# Patient Record
Sex: Male | Born: 1975
Health system: Southern US, Community
[De-identification: ages and names within clinical notes are randomized; demographics above are authoritative.]

## PROBLEM LIST (undated history)

## (undated) DIAGNOSIS — J45909 Unspecified asthma, uncomplicated: Secondary | ICD-10-CM

## (undated) HISTORY — PX: HAND SURGERY: SHX662

---

## 2010-04-20 ENCOUNTER — Emergency Department (INDEPENDENT_AMBULATORY_CARE_PROVIDER_SITE_OTHER): Payer: Self-pay

## 2010-04-20 ENCOUNTER — Emergency Department (HOSPITAL_BASED_OUTPATIENT_CLINIC_OR_DEPARTMENT_OTHER)
Admission: EM | Admit: 2010-04-20 | Discharge: 2010-04-20 | Payer: Self-pay | Attending: Emergency Medicine | Admitting: Emergency Medicine

## 2010-04-20 DIAGNOSIS — F17200 Nicotine dependence, unspecified, uncomplicated: Secondary | ICD-10-CM | POA: Insufficient documentation

## 2010-04-20 DIAGNOSIS — M25569 Pain in unspecified knee: Secondary | ICD-10-CM

## 2010-04-20 DIAGNOSIS — Z043 Encounter for examination and observation following other accident: Secondary | ICD-10-CM

## 2010-04-20 DIAGNOSIS — Y92410 Unspecified street and highway as the place of occurrence of the external cause: Secondary | ICD-10-CM | POA: Insufficient documentation

## 2010-04-20 DIAGNOSIS — IMO0002 Reserved for concepts with insufficient information to code with codable children: Secondary | ICD-10-CM | POA: Insufficient documentation

## 2010-04-20 DIAGNOSIS — J45909 Unspecified asthma, uncomplicated: Secondary | ICD-10-CM | POA: Insufficient documentation

## 2010-10-01 ENCOUNTER — Emergency Department (INDEPENDENT_AMBULATORY_CARE_PROVIDER_SITE_OTHER): Payer: Medicare Other

## 2010-10-01 ENCOUNTER — Emergency Department (HOSPITAL_BASED_OUTPATIENT_CLINIC_OR_DEPARTMENT_OTHER)
Admission: EM | Admit: 2010-10-01 | Discharge: 2010-10-01 | Disposition: A | Discharge status: Home / Self Care | Payer: Medicare Other | Attending: Emergency Medicine | Admitting: Emergency Medicine

## 2010-10-01 DIAGNOSIS — R0602 Shortness of breath: Secondary | ICD-10-CM | POA: Insufficient documentation

## 2010-10-01 DIAGNOSIS — J45909 Unspecified asthma, uncomplicated: Secondary | ICD-10-CM

## 2010-10-01 DIAGNOSIS — R05 Cough: Secondary | ICD-10-CM

## 2010-10-01 MED ORDER — ALBUTEROL SULFATE (5 MG/ML) 0.5% IN NEBU
INHALATION_SOLUTION | RESPIRATORY_TRACT | Status: AC
  Administered 2010-10-01: 5 mg via RESPIRATORY_TRACT
  Filled 2010-10-01: qty 1

## 2010-10-01 MED ORDER — ALBUTEROL SULFATE HFA 108 (90 BASE) MCG/ACT IN AERS: 1.0000 | INHALATION_SPRAY | Freq: Four times a day (QID) | RESPIRATORY_TRACT | Status: DC | PRN

## 2010-10-01 MED ORDER — ALBUTEROL SULFATE (5 MG/ML) 0.5% IN NEBU
5.0000 mg | INHALATION_SOLUTION | Freq: Once | RESPIRATORY_TRACT | Status: AC
Start: 2010-10-01 — End: 2010-10-01
  Administered 2010-10-01 (×2): 5 mg via RESPIRATORY_TRACT
  Filled 2010-10-01: qty 1

## 2010-10-01 MED ORDER — IPRATROPIUM BROMIDE 0.02 % IN SOLN
0.5000 mg | Freq: Once | RESPIRATORY_TRACT | Status: AC
  Administered 2010-10-01: 0.5 mg via RESPIRATORY_TRACT
  Filled 2010-10-01: qty 2.5

## 2010-10-01 MED ORDER — ALBUTEROL SULFATE HFA 108 (90 BASE) MCG/ACT IN AERS
2.0000 | INHALATION_SPRAY | Freq: Once | RESPIRATORY_TRACT | Status: AC
  Administered 2010-10-01: 2 via RESPIRATORY_TRACT
  Filled 2010-10-01: qty 6.7

## 2010-10-01 MED ORDER — ALBUTEROL SULFATE (5 MG/ML) 0.5% IN NEBU: 5.0000 mg | INHALATION_SOLUTION | Freq: Once | RESPIRATORY_TRACT | Status: DC

## 2010-10-01 NOTE — ED Notes (Signed)
C/o wheezing, nonprod cough started last night

## 2010-10-01 NOTE — ED Provider Notes (Deleted)
History     CSN: [id number] Arrival date & time: 10/01/2010  7:28 PM  Chief Complaint  Patient presents with  . Asthma   HPI  Past Medical History  Diagnosis Date  . Asthma     History reviewed. No pertinent past surgical history.  No family history on file.  History  Substance Use Topics  . Smoking status: Never Smoker   . Smokeless tobacco: Not on file  . Alcohol Use: No      Review of Systems  Physical Exam  BP 112/68  Pulse 78  Temp(Src) 99.7 F (37.6 C) (Oral)  Resp 18  Ht 5\' 11"  (1.803 m)  Wt 200 lb (90.719 kg)  BMI 27.89 kg/m2  SpO2 98%  Physical Exam  ED Course  Procedures   No results found for this or any previous visit. Dg Chest 2 View  10/01/2010  *RADIOLOGY REPORT*  Clinical Data: Shortness of breath and cough; history of asthma.  CHEST - 2 VIEW  Comparison: None.  Findings: The lungs are well-aerated.  Peribronchial thickening is noted.  There is no evidence of focal opacification, pleural effusion or pneumothorax.  The heart is normal in size; the mediastinal contour is within normal limits.  No acute osseous abnormalities are seen.  IMPRESSION: Peribronchial thickening noted; lungs otherwise clear.  Original Report Authenticated By: Tonia Ghent, M.D.     MDM HX OF ASTHMA WHEEZING RESOLVED IN ED WITH NEBULIZER X2 ALBUTEROL ATROVENT. CXR NEG.       Shelda Jakes, MD 10/02/10 [phone]

## 2010-10-01 NOTE — ED Notes (Signed)
Pt has an hx of asthma and is currently out of his inhaler (Albuterol) but is here in the ED due to [facility] (ins_exp) bilateral wheezes.

## 2010-10-01 NOTE — ED Notes (Signed)
Pt has gotten 2 HHN tx (Albuterol/Atrovent) & Albuterol 5mg  and a chest Xray

## 2010-10-01 NOTE — ED Provider Notes (Signed)
History  Scribed for Dr. Mellody Life, the patient was seen in room 4. The chart was scribed by Gilman Schmidt. The patients care was started at 2240.   CSN: [id number] Arrival date & time: 10/01/2010  7:28 PM  Chief Complaint  Patient presents with  . Asthma   HPI Alexis Chapman is a 35 y.o. male who presents to the Emergency Department complaining of asthma. Pt describes wheezing, difficulty breathing, chest tightness, and nonproductive cough onset last night. States symptoms have improved after two breathing treatment in ED. Symptoms are normally alleviated by inhaler but pt reports running out last week. Denies any fever, abdominal pain, rash, headache, N/V. There are no other associated symptoms and no other alleviating or aggravating factors.   HPI ELEMENTS: Onset: last night Duration: persistent since onset  Modifying factors: inhaler Context: as above  Associated symptoms: denies any fever, abdominal pain, rash, headache, N/V   PAST MEDICAL HISTORY:  Past Medical History  Diagnosis Date  . Asthma      PAST SURGICAL HISTORY:  History reviewed. No pertinent past surgical history.   MEDICATIONS:  Previous Medications   ALBUTEROL (PROVENTIL HFA;VENTOLIN HFA) 108 (90 BASE) MCG/ACT INHALER    Inhale 2 puffs into the lungs 3 (three) times daily.     ALBUTEROL (PROVENTIL) (2.5 MG/3ML) 0.083% NEBULIZER SOLUTION    Take 2.5 mg by nebulization 3 (three) times daily.     IPRATROPIUM (ATROVENT) 0.02 % NEBULIZER SOLUTION    Take 500 mcg by nebulization 3 (three) times daily.     MOMETASONE (NASONEX) 50 MCG/ACT NASAL SPRAY    Place 1 spray into the nose daily.       ALLERGIES:  Allergies as of 10/01/2010 - Review Complete 10/01/2010  Allergen Reaction Noted  . Shellfish allergy Anaphylaxis 10/01/2010  . Penicillins  10/01/2010     FAMILY HISTORY:  No family history on file.   SOCIAL HISTORY: History  Substance Use Topics  . Smoking status: Never Smoker   . Smokeless tobacco: Not  on file  . Alcohol Use: No      Review of Systems  Constitutional: Negative for fever.  Respiratory: Positive for cough, chest tightness and wheezing.        Difficulty breathing  Gastrointestinal: Negative for nausea, vomiting and abdominal pain.  Skin: Negative for rash.  Neurological: Negative for headaches.    Physical Exam  BP 112/68  Pulse 78  Temp(Src) 99.7 F (37.6 C) (Oral)  Resp 18  Ht 5\' 11"  (1.803 m)  Wt 200 lb (90.719 kg)  BMI 27.89 kg/m2  SpO2 98%  Physical Exam  Nursing note and vitals reviewed. Constitutional: He is oriented to person, place, and time. He appears well-developed and well-nourished.  HENT:  Head: Normocephalic and atraumatic.  Eyes: Conjunctivae and EOM are normal. Pupils are equal, round, and reactive to light.  Neck: Normal range of motion. Neck supple.  Cardiovascular: Normal rate, regular rhythm and normal heart sounds.   Pulmonary/Chest: Effort normal and breath sounds normal. He has no wheezes.  Abdominal: Soft. Bowel sounds are normal. There is no tenderness.  Musculoskeletal: Normal range of motion.  Neurological: He is alert and oriented to person, place, and time.  Skin: Skin is warm and dry.  Psychiatric: He has a normal mood and affect.      OTHER DATA REVIEWED: Nursing notes, vital signs, and past medical records reviewed.   DIAGNOSTIC STUDIES: Oxygen Saturation is 98% on room air, normal by my interpretation.    RADIOLOGY:  DG Chest:  IMPRESSION: Peribronchial thickening noted; lungs otherwise clear.  Original Report Authenticated By: Tonia Ghent, M.D   PROCEDURES: NONE   ED COURSE / COORDINATION OF CARE:    IMPRESSION: Diagnoses that have been ruled out:  Diagnoses that are still under consideration:  Final diagnoses:  Unspecified asthma    PLAN:  Home  The patient is to return the emergency department if there is any worsening of symptoms. I have reviewed the discharge instructions with the  PATIENT  CONDITION ON DISCHARGE: IMPROVED STABLE  MEDICATIONS GIVEN IN THE E.D.  Medications  albuterol (PROVENTIL HFA;VENTOLIN HFA) 108 (90 BASE) MCG/ACT inhaler (not administered)  albuterol (PROVENTIL) (2.5 MG/3ML) 0.083% nebulizer solution (not administered)  ipratropium (ATROVENT) 0.02 % nebulizer solution (not administered)  mometasone (NASONEX) 50 MCG/ACT nasal spray (not administered)  albuterol (PROVENTIL) (5 MG/ML) 0.5% nebulizer solution 5 mg (not administered)  albuterol (PROVENTIL HFA;VENTOLIN HFA) 108 (90 BASE) MCG/ACT inhaler (not administered)  albuterol (PROVENTIL) (5 MG/ML) 0.5% nebulizer solution 5 mg (5 mg Nebulization Given 10/01/10 2015)  ipratropium (ATROVENT) 0.02 % nebulizer solution 0.5 mg (0.5 mg Nebulization Given 10/01/10 1947)  albuterol (PROVENTIL HFA;VENTOLIN HFA) inhaler 2 puff (2 puff Inhalation Given 10/01/10 2316)    DISCHARGE MEDICATIONS: New Prescriptions   ALBUTEROL (PROVENTIL HFA;VENTOLIN HFA) 108 (90 BASE) MCG/ACT INHALER    Inhale 1-2 puffs into the lungs every 6 (six) hours as needed for wheezing.    SCRIBE ATTESTATION:   I personally performed the services described in this documentation, which was scribed in my presence. The recorded information has been reviewed and considered. No att. providers found   Procedures  MDM KNOWN ASTHMATIC AND WHEEZING RESOLVED WITH 2 NEBS ALBUTEROL AND ATROVENT IN ED. CXR NEGATIVE.       Shelda Jakes, MD 10/02/10 [phone]

## 2010-10-01 NOTE — ED Notes (Signed)
Patient states that he has been with no RX and sick for the past two days with no Albuterol  inhaler.

## 2010-10-01 NOTE — ED Notes (Signed)
RT at bedside administering breathing tx.

## 2012-05-04 ENCOUNTER — Emergency Department (HOSPITAL_BASED_OUTPATIENT_CLINIC_OR_DEPARTMENT_OTHER)
Admission: EM | Admit: 2012-05-04 | Discharge: 2012-05-04 | Disposition: A | Discharge status: Home / Self Care | Payer: Medicare Other | Attending: Emergency Medicine | Admitting: Emergency Medicine

## 2012-05-04 ENCOUNTER — Encounter (HOSPITAL_BASED_OUTPATIENT_CLINIC_OR_DEPARTMENT_OTHER): Payer: Self-pay | Admitting: *Deleted

## 2012-05-04 DIAGNOSIS — J9801 Acute bronchospasm: Secondary | ICD-10-CM

## 2012-05-04 DIAGNOSIS — Z79899 Other long term (current) drug therapy: Secondary | ICD-10-CM | POA: Insufficient documentation

## 2012-05-04 DIAGNOSIS — J45901 Unspecified asthma with (acute) exacerbation: Secondary | ICD-10-CM | POA: Insufficient documentation

## 2012-05-04 DIAGNOSIS — R059 Cough, unspecified: Secondary | ICD-10-CM | POA: Insufficient documentation

## 2012-05-04 DIAGNOSIS — R05 Cough: Secondary | ICD-10-CM | POA: Insufficient documentation

## 2012-05-04 DIAGNOSIS — R0789 Other chest pain: Secondary | ICD-10-CM | POA: Insufficient documentation

## 2012-05-04 MED ORDER — ALBUTEROL SULFATE HFA 108 (90 BASE) MCG/ACT IN AERS
2.0000 | INHALATION_SPRAY | Freq: Once | RESPIRATORY_TRACT | Status: AC
  Administered 2012-05-04: 2 via RESPIRATORY_TRACT
  Filled 2012-05-04: qty 6.7

## 2012-05-04 MED ORDER — IPRATROPIUM BROMIDE 0.02 % IN SOLN: 0.5000 mg | Freq: Once | RESPIRATORY_TRACT | Status: DC

## 2012-05-04 MED ORDER — ALBUTEROL SULFATE (5 MG/ML) 0.5% IN NEBU
5.0000 mg | INHALATION_SOLUTION | Freq: Once | RESPIRATORY_TRACT | Status: AC
  Administered 2012-05-04: 5 mg via RESPIRATORY_TRACT
  Filled 2012-05-04: qty 1

## 2012-05-04 MED ORDER — PREDNISONE 50 MG PO TABS
60.0000 mg | ORAL_TABLET | Freq: Once | ORAL | Status: AC
  Administered 2012-05-04: 50 mg via ORAL
  Filled 2012-05-04: qty 1

## 2012-05-04 NOTE — ED Notes (Addendum)
Cough, runny nose and headache since working in the yard 2 days ago. Hx of asthma. He ran out of Albuterol last week. Chest tightness with breathing.

## 2012-05-04 NOTE — ED Provider Notes (Signed)
History     CSN: [id number]  Arrival date & time 05/04/12  1419   First MD Initiated Contact with Patient 05/04/12 1436      Chief Complaint  Patient presents with  . URI    (Consider location/radiation/quality/duration/timing/severity/associated sxs/prior treatment) HPI Comments: 37 year old male with a past medical history of asthma presents the emergency department complaining of chest tightness and wheezing x3 days. Patient states he was out working in the yard a few days ago when his symptoms began. Since then he has had a productive cough with clear phlegm, chest tightness and wheezing. He ran out of his albuterol inhaler one week ago. Despite triage summary, patient denies headache or head congestion. No fever, chills, chest pain or shortness of breath.  Patient is a 37 y.o. male presenting with URI. The history is provided by the patient.  URI Presenting symptoms: cough   Presenting symptoms: no congestion, no fever and no sore throat   Associated symptoms: wheezing     Past Medical History  Diagnosis Date  . Asthma     History reviewed. No pertinent past surgical history.  No family history on file.  History  Substance Use Topics  . Smoking status: Never Smoker   . Smokeless tobacco: Not on file  . Alcohol Use: No      Review of Systems  Constitutional: Negative for fever and chills.  HENT: Negative for congestion, sore throat and sinus pressure.   Respiratory: Positive for cough, chest tightness and wheezing. Negative for shortness of breath.   Cardiovascular: Negative for chest pain.  All other systems reviewed and are negative.    Allergies  Shellfish allergy and Penicillins  Home Medications   Current Outpatient Rx  Name  Route  Sig  Dispense  Refill  . albuterol (PROVENTIL HFA;VENTOLIN HFA) 108 (90 BASE) MCG/ACT inhaler   Inhalation   Inhale 2 puffs into the lungs 3 (three) times daily.           Marland Kitchen EXPIRED: albuterol (PROVENTIL  HFA;VENTOLIN HFA) 108 (90 BASE) MCG/ACT inhaler   Inhalation   Inhale 1-2 puffs into the lungs every 6 (six) hours as needed for wheezing.   1 Inhaler   0   . albuterol (PROVENTIL) (2.5 MG/3ML) 0.083% nebulizer solution   Nebulization   Take 2.5 mg by nebulization 3 (three) times daily.           Marland Kitchen ipratropium (ATROVENT) 0.02 % nebulizer solution   Nebulization   Take 500 mcg by nebulization 3 (three) times daily.           . mometasone (NASONEX) 50 MCG/ACT nasal spray   Nasal   Place 1 spray into the nose daily.             BP 119/75  Pulse 88  Temp(Src) 98.9 F (37.2 C) (Oral)  Resp 18  Ht 5\' 11"  (1.803 m)  Wt 170 lb (77.111 kg)  BMI 23.72 kg/m2  SpO2 97%  Physical Exam  Nursing note and vitals reviewed. Constitutional: He is oriented to person, place, and time. He appears well-developed and well-nourished. No distress.  HENT:  Head: Normocephalic and atraumatic.  Nose: No mucosal edema or rhinorrhea. Right sinus exhibits no maxillary sinus tenderness and no frontal sinus tenderness. Left sinus exhibits no maxillary sinus tenderness and no frontal sinus tenderness.  Mouth/Throat: Uvula is midline and mucous membranes are normal. Posterior oropharyngeal erythema present. No oropharyngeal exudate or posterior oropharyngeal edema.  Eyes: Conjunctivae are normal.  Neck:  Normal range of motion. Neck supple.  Cardiovascular: Normal rate, regular rhythm, normal heart sounds and intact distal pulses.   Pulmonary/Chest: Effort normal. No accessory muscle usage. No respiratory distress. He has wheezes (scattered inspiratory and expiratory bilateral). He has no rhonchi. He has no rales.  Musculoskeletal: Normal range of motion. He exhibits no edema.  Lymphadenopathy:    He has no cervical adenopathy.  Neurological: He is alert and oriented to person, place, and time.  Skin: Skin is warm and dry. He is not diaphoretic.  Psychiatric: He has a normal mood and affect. His  behavior is normal.    ED Course  Procedures (including critical care time)  Labs Reviewed - No data to display No results found.   1. Asthma exacerbation   2. Bronchospasm       MDM  37 y/o asthmatic male with wheezing. Afebrile, vitals stable, NAD. Giving albuterol breathing treatment and 60mg  PO prednisone. 3:26 PM Marked clinical improvement noted with albuterol breathing treatment and prednisone. Will discharge with albuterol inhaler. Resource guide given for PCP follow up. Return precautions discussed. Patient states understanding of plan and is agreeable.        Trevor Mace, PA-C 05/04/12 1527

## 2012-05-06 NOTE — ED Provider Notes (Signed)
Medical screening examination/treatment/procedure(s) were performed by non-physician practitioner and as supervising physician I was immediately available for consultation/collaboration.  Evelyn Moch T Quaran Kedzierski, MD 05/06/12 0829 

## 2012-10-23 ENCOUNTER — Encounter (HOSPITAL_BASED_OUTPATIENT_CLINIC_OR_DEPARTMENT_OTHER): Payer: Self-pay

## 2012-10-23 ENCOUNTER — Emergency Department (HOSPITAL_BASED_OUTPATIENT_CLINIC_OR_DEPARTMENT_OTHER)
Admission: EM | Admit: 2012-10-23 | Discharge: 2012-10-23 | Disposition: A | Discharge status: Home / Self Care | Payer: Medicare Other | Attending: Emergency Medicine | Admitting: Emergency Medicine

## 2012-10-23 DIAGNOSIS — J069 Acute upper respiratory infection, unspecified: Secondary | ICD-10-CM | POA: Insufficient documentation

## 2012-10-23 DIAGNOSIS — Z79899 Other long term (current) drug therapy: Secondary | ICD-10-CM | POA: Insufficient documentation

## 2012-10-23 DIAGNOSIS — J45909 Unspecified asthma, uncomplicated: Secondary | ICD-10-CM | POA: Insufficient documentation

## 2012-10-23 DIAGNOSIS — Z880 Allergy status to penicillin: Secondary | ICD-10-CM | POA: Insufficient documentation

## 2012-10-23 MED ORDER — ALBUTEROL SULFATE HFA 108 (90 BASE) MCG/ACT IN AERS
INHALATION_SPRAY | RESPIRATORY_TRACT | Status: AC
  Filled 2012-10-23: qty 6.7

## 2012-10-23 MED ORDER — PREDNISONE 10 MG PO TABS: 20.0000 mg | ORAL_TABLET | Freq: Every day | ORAL | Status: DC

## 2012-10-23 MED ORDER — ALBUTEROL SULFATE HFA 108 (90 BASE) MCG/ACT IN AERS
2.0000 | INHALATION_SPRAY | RESPIRATORY_TRACT | Status: DC
  Administered 2012-10-23: 2 via RESPIRATORY_TRACT

## 2012-10-23 MED ORDER — ALBUTEROL SULFATE (5 MG/ML) 0.5% IN NEBU
INHALATION_SOLUTION | RESPIRATORY_TRACT | Status: AC
  Administered 2012-10-23: 5 mg via RESPIRATORY_TRACT
  Filled 2012-10-23: qty 1

## 2012-10-23 MED ORDER — ALBUTEROL SULFATE (5 MG/ML) 0.5% IN NEBU
5.0000 mg | INHALATION_SOLUTION | Freq: Once | RESPIRATORY_TRACT | Status: AC
  Administered 2012-10-23: 5 mg via RESPIRATORY_TRACT

## 2012-10-23 NOTE — ED Provider Notes (Signed)
CSN: [id number]     Arrival date & time 10/23/12  2137 History  This chart was scribed for Hilario Quarry, MD by Ronal Fear, ED Scribe. This patient was seen in room MH03/MH03 and the patient's care was started at 10:51 PM.   Chief Complaint  Patient presents with  . Cough    Patient is a 37 y.o. male presenting with cough.  Cough Cough characteristics:  Productive Sputum characteristics:  Clear Severity:  Mild Onset quality:  Sudden Duration:  1 day Timing:  Intermittent Progression:  Resolved Chronicity:  Recurrent Smoker: yes   Worsened by:  Environmental changes Associated symptoms: chills   Associated symptoms: no fever and no wheezing     HPI Comments: Dquan Cortopassi is a 37 y.o. male with a hx of asthma who presents to the Emergency Department complaining of a productive cough associated with his asthma onset 1 day ago. Pt states that he began feeling sick with the weather change. Pt was given 2 albuterol treatments prior to his evaluation. He denies fever. but has chills. Denies any other medical problems. Pt is a smoker. He appears to be in no acute distress after the treatments, with no other complaints.   Past Medical History  Diagnosis Date  . Asthma    Past Surgical History  Procedure Laterality Date  . Hand surgery     No family history on file. History  Substance Use Topics  . Smoking status: Never Smoker   . Smokeless tobacco: Not on file  . Alcohol Use: Yes    Review of Systems  Constitutional: Positive for chills. Negative for fever.  Respiratory: Positive for cough. Negative for wheezing.   All other systems reviewed and are negative.    Allergies  Shellfish allergy and Penicillins  Home Medications   Current Outpatient Rx  Name  Route  Sig  Dispense  Refill  . albuterol (PROVENTIL HFA;VENTOLIN HFA) 108 (90 BASE) MCG/ACT inhaler   Inhalation   Inhale 2 puffs into the lungs 3 (three) times daily.           Marland Kitchen EXPIRED: albuterol  (PROVENTIL HFA;VENTOLIN HFA) 108 (90 BASE) MCG/ACT inhaler   Inhalation   Inhale 1-2 puffs into the lungs every 6 (six) hours as needed for wheezing.   1 Inhaler   0   . albuterol (PROVENTIL) (2.5 MG/3ML) 0.083% nebulizer solution   Nebulization   Take 2.5 mg by nebulization 3 (three) times daily.           Marland Kitchen ipratropium (ATROVENT) 0.02 % nebulizer solution   Nebulization   Take 500 mcg by nebulization 3 (three) times daily.           . mometasone (NASONEX) 50 MCG/ACT nasal spray   Nasal   Place 1 spray into the nose daily.            BP 134/76  Pulse 95  Temp(Src) 99.4 F (37.4 C) (Oral)  Resp 20  SpO2 98% Physical Exam  Nursing note and vitals reviewed. Constitutional: He is oriented to person, place, and time. He appears well-developed and well-nourished. No distress.  HENT:  Head: Normocephalic and atraumatic.  Eyes: EOM are normal.  Neck: Neck supple. No tracheal deviation present.  Cardiovascular: Normal rate.   Pulmonary/Chest: Effort normal and breath sounds normal. No respiratory distress. He has no wheezes.  Musculoskeletal: Normal range of motion.  Neurological: He is alert and oriented to person, place, and time.  Skin: Skin is warm and dry.  Psychiatric: He has a normal mood and affect. His behavior is normal.    ED Course  Procedures (including critical care time)  DIAGNOSTIC STUD  COORDINATION OF CARE: 10:56 PM- Pt advised of plan for treatment including albuterol inhaler and a breathing treatment which he was already given prior to his examination and pt understood.       Labs Review Labs Reviewed - No data to display Imaging Review No results found.  MDM  No diagnosis found. I personally performed the services described in this documentation, which was scribed in my presence. The recorded information has been reviewed and considered.    Hilario Quarry, MD 10/23/12 2352

## 2012-10-23 NOTE — ED Notes (Signed)
C/o prod cough started yesterday

## 2013-06-07 ENCOUNTER — Emergency Department (HOSPITAL_BASED_OUTPATIENT_CLINIC_OR_DEPARTMENT_OTHER)
Admission: EM | Admit: 2013-06-07 | Discharge: 2013-06-07 | Disposition: A | Discharge status: Home / Self Care | Payer: Medicare Other | Attending: Emergency Medicine | Admitting: Emergency Medicine

## 2013-06-07 ENCOUNTER — Encounter (HOSPITAL_BASED_OUTPATIENT_CLINIC_OR_DEPARTMENT_OTHER): Payer: Self-pay | Admitting: Emergency Medicine

## 2013-06-07 DIAGNOSIS — J45909 Unspecified asthma, uncomplicated: Secondary | ICD-10-CM | POA: Diagnosis not present

## 2013-06-07 DIAGNOSIS — F17200 Nicotine dependence, unspecified, uncomplicated: Secondary | ICD-10-CM | POA: Diagnosis not present

## 2013-06-07 DIAGNOSIS — Z880 Allergy status to penicillin: Secondary | ICD-10-CM | POA: Insufficient documentation

## 2013-06-07 DIAGNOSIS — Z79899 Other long term (current) drug therapy: Secondary | ICD-10-CM | POA: Insufficient documentation

## 2013-06-07 DIAGNOSIS — IMO0002 Reserved for concepts with insufficient information to code with codable children: Secondary | ICD-10-CM | POA: Insufficient documentation

## 2013-06-07 DIAGNOSIS — J069 Acute upper respiratory infection, unspecified: Secondary | ICD-10-CM | POA: Diagnosis not present

## 2013-06-07 MED ORDER — FLUTICASONE PROPIONATE 50 MCG/ACT NA SUSP: 2.0000 | Freq: Every day | NASAL | Status: DC

## 2013-06-07 MED ORDER — FEXOFENADINE-PSEUDOEPHED ER 60-120 MG PO TB12: 1.0000 | ORAL_TABLET | Freq: Two times a day (BID) | ORAL | Status: DC

## 2013-06-07 MED ORDER — DEXAMETHASONE SODIUM PHOSPHATE 10 MG/ML IJ SOLN
10.0000 mg | Freq: Once | INTRAMUSCULAR | Status: AC
  Administered 2013-06-07: 10 mg via INTRAMUSCULAR
  Filled 2013-06-07: qty 1

## 2013-06-07 NOTE — Discharge Instructions (Signed)

## 2013-06-07 NOTE — ED Provider Notes (Signed)
CSN: [id number]     Arrival date & time 06/07/13  1834 History  This chart was scribed for Rolan BuccoMelanie Graiden Henes, MD by Ardelia Memsylan Malpass, ED Scribe. This patient was seen in room MHT13/MHT13 and the patient's care was started at 7:54 PM.   Chief Complaint  Patient presents with  . Nasal Congestion    The history is provided by the patient. No language interpreter was used.    HPI Comments: Alexis Chapman is a 38 y.o. Male with a history of asthma who presents to the Emergency Department complaining of sinus congestion onset last night. He reports associated rhinorrhea and chest congestion. He states that he has a history of allergies. He states that he has not tried any medications for his symptoms. He denies emesis, diarrhea, fever, SOB or any other symptoms. Pt is a current every day smoker.    Past Medical History  Diagnosis Date  . Asthma    Past Surgical History  Procedure Laterality Date  . Hand surgery     History reviewed. No pertinent family history. History  Substance Use Topics  . Smoking status: Current Every Day Smoker -- 0.50 packs/day    Types: Cigarettes  . Smokeless tobacco: Not on file  . Alcohol Use: Yes    Review of Systems  Constitutional: Negative for fever, chills, diaphoresis and fatigue.  HENT: Positive for congestion and rhinorrhea. Negative for sneezing.   Eyes: Negative.   Respiratory: Negative for cough, chest tightness and shortness of breath.   Cardiovascular: Negative for chest pain and leg swelling.  Gastrointestinal: Negative for nausea, vomiting, abdominal pain, diarrhea and blood in stool.  Genitourinary: Negative for frequency, hematuria, flank pain and difficulty urinating.  Musculoskeletal: Negative for arthralgias and back pain.  Skin: Negative for rash.  Neurological: Negative for dizziness, speech difficulty, weakness, numbness and headaches.   Allergies  Shellfish allergy and Penicillins  Home Medications   Prior to Admission medications    Medication Sig Start Date End Date Taking? Authorizing Provider  albuterol (PROVENTIL HFA;VENTOLIN HFA) 108 (90 BASE) MCG/ACT inhaler Inhale 2 puffs into the lungs 3 (three) times daily.      Historical Provider, MD  albuterol (PROVENTIL HFA;VENTOLIN HFA) 108 (90 BASE) MCG/ACT inhaler Inhale 1-2 puffs into the lungs every 6 (six) hours as needed for wheezing. 10/01/10 10/01/11  Shelda JakesScott W. Zackowski, MD  albuterol (PROVENTIL) (2.5 MG/3ML) 0.083% nebulizer solution Take 2.5 mg by nebulization 3 (three) times daily.      Historical Provider, MD  ipratropium (ATROVENT) 0.02 % nebulizer solution Take 500 mcg by nebulization 3 (three) times daily.      Historical Provider, MD  mometasone (NASONEX) 50 MCG/ACT nasal spray Place 1 spray into the nose daily.      Historical Provider, MD  predniSONE (DELTASONE) 10 MG tablet Take 2 tablets (20 mg total) by mouth daily. 10/23/12   Hilario Quarryanielle S Ray, MD   Triage Vitals: BP 130/75  Pulse 96  Temp(Src) 99.3 F (37.4 C) (Oral)  Resp 16  Ht 5\' 11"  (1.803 m)  Wt 200 lb (90.719 kg)  BMI 27.91 kg/m2  SpO2 99%  Physical Exam  Nursing note and vitals reviewed. Constitutional: He is oriented to person, place, and time. He appears well-developed and well-nourished.  HENT:  Head: Normocephalic and atraumatic.  Right Ear: Tympanic membrane normal.  Left Ear: Tympanic membrane normal.  Nose: Rhinorrhea (clear) present.  Mouth/Throat: Oropharynx is clear and moist.  Eyes: Pupils are equal, round, and reactive to light.  Neck: Normal  range of motion. Neck supple.  Cardiovascular: Normal rate, regular rhythm and normal heart sounds.   Pulmonary/Chest: Effort normal and breath sounds normal. No respiratory distress. He has no wheezes. He has no rales. He exhibits no tenderness.  Abdominal: Soft. Bowel sounds are normal. There is no tenderness. There is no rebound and no guarding.  Musculoskeletal: Normal range of motion. He exhibits no edema.  Lymphadenopathy:    He  has no cervical adenopathy.  Neurological: He is alert and oriented to person, place, and time.  Skin: Skin is warm and dry. No rash noted.  Psychiatric: He has a normal mood and affect.    ED Course  Procedures (including critical care time)  DIAGNOSTIC STUDIES: Oxygen Saturation is 99% on RA, normal by my interpretation.    COORDINATION OF CARE: 7:57 PM- Discussed plan to start pt on allergy medications and nasal spray. Pt advised of plan for treatment and pt agrees.  Labs Review Labs Reviewed - No data to display  Imaging Review No results found.   EKG Interpretation None      MDM   Final diagnoses:  URI (upper respiratory infection)    Patient presents with a one-day history of nasal congestion. His significant other had recent similar symptoms. He was given a shot of Decadron in the ED and a prescription for Allegra-D and Flonase to use for symptomatic treatment. He's well appearing. Lungs are clear without evidence of pneumonia.   I personally performed the services described in this documentation, which was scribed in my presence.  The recorded information has been reviewed and considered.   Rolan BuccoMelanie Karanvir Balderston, MD 06/07/13 2039

## 2013-06-07 NOTE — ED Notes (Signed)
Pt co URi symptoms x 1 day 

## 2014-01-28 DIAGNOSIS — R079 Chest pain, unspecified: Secondary | ICD-10-CM | POA: Diagnosis not present

## 2014-01-28 DIAGNOSIS — R05 Cough: Secondary | ICD-10-CM | POA: Diagnosis not present

## 2014-01-28 DIAGNOSIS — R0602 Shortness of breath: Secondary | ICD-10-CM | POA: Diagnosis not present

## 2014-01-28 DIAGNOSIS — R198 Other specified symptoms and signs involving the digestive system and abdomen: Secondary | ICD-10-CM | POA: Diagnosis not present

## 2014-01-28 DIAGNOSIS — J45901 Unspecified asthma with (acute) exacerbation: Secondary | ICD-10-CM | POA: Diagnosis not present

## 2015-04-17 ENCOUNTER — Encounter (HOSPITAL_COMMUNITY): Payer: Self-pay | Admitting: Emergency Medicine

## 2015-04-17 ENCOUNTER — Inpatient Hospital Stay (HOSPITAL_COMMUNITY)
Admission: EM | Admit: 2015-04-17 | Discharge: 2015-04-20 | DRG: 465 | Disposition: A | Discharge status: Home / Self Care | Payer: Medicare Other | Attending: General Surgery | Admitting: General Surgery

## 2015-04-17 ENCOUNTER — Emergency Department (HOSPITAL_COMMUNITY): Payer: Medicare Other

## 2015-04-17 DIAGNOSIS — F17210 Nicotine dependence, cigarettes, uncomplicated: Secondary | ICD-10-CM | POA: Diagnosis present

## 2015-04-17 DIAGNOSIS — S3993XA Unspecified injury of pelvis, initial encounter: Secondary | ICD-10-CM | POA: Diagnosis not present

## 2015-04-17 DIAGNOSIS — S298XXA Other specified injuries of thorax, initial encounter: Secondary | ICD-10-CM | POA: Diagnosis not present

## 2015-04-17 DIAGNOSIS — S40211A Abrasion of right shoulder, initial encounter: Secondary | ICD-10-CM | POA: Diagnosis not present

## 2015-04-17 DIAGNOSIS — S62304A Unspecified fracture of fourth metacarpal bone, right hand, initial encounter for closed fracture: Secondary | ICD-10-CM | POA: Diagnosis not present

## 2015-04-17 DIAGNOSIS — S1093XA Contusion of unspecified part of neck, initial encounter: Secondary | ICD-10-CM | POA: Diagnosis present

## 2015-04-17 DIAGNOSIS — R079 Chest pain, unspecified: Secondary | ICD-10-CM | POA: Diagnosis not present

## 2015-04-17 DIAGNOSIS — S30810A Abrasion of lower back and pelvis, initial encounter: Secondary | ICD-10-CM | POA: Diagnosis present

## 2015-04-17 DIAGNOSIS — S62394A Other fracture of fourth metacarpal bone, right hand, initial encounter for closed fracture: Secondary | ICD-10-CM | POA: Diagnosis not present

## 2015-04-17 DIAGNOSIS — T07 Unspecified multiple injuries: Secondary | ICD-10-CM | POA: Diagnosis not present

## 2015-04-17 DIAGNOSIS — J45909 Unspecified asthma, uncomplicated: Secondary | ICD-10-CM | POA: Diagnosis present

## 2015-04-17 DIAGNOSIS — S62308A Unspecified fracture of other metacarpal bone, initial encounter for closed fracture: Secondary | ICD-10-CM | POA: Diagnosis present

## 2015-04-17 DIAGNOSIS — Z880 Allergy status to penicillin: Secondary | ICD-10-CM

## 2015-04-17 DIAGNOSIS — S0990XA Unspecified injury of head, initial encounter: Secondary | ICD-10-CM | POA: Diagnosis not present

## 2015-04-17 DIAGNOSIS — S299XXA Unspecified injury of thorax, initial encounter: Secondary | ICD-10-CM | POA: Diagnosis not present

## 2015-04-17 DIAGNOSIS — S80812A Abrasion, left lower leg, initial encounter: Secondary | ICD-10-CM | POA: Diagnosis present

## 2015-04-17 DIAGNOSIS — S30811A Abrasion of abdominal wall, initial encounter: Secondary | ICD-10-CM | POA: Diagnosis not present

## 2015-04-17 DIAGNOSIS — T148 Other injury of unspecified body region: Secondary | ICD-10-CM | POA: Diagnosis not present

## 2015-04-17 DIAGNOSIS — R52 Pain, unspecified: Secondary | ICD-10-CM | POA: Diagnosis not present

## 2015-04-17 DIAGNOSIS — S43101A Unspecified dislocation of right acromioclavicular joint, initial encounter: Secondary | ICD-10-CM | POA: Diagnosis present

## 2015-04-17 DIAGNOSIS — S62512A Displaced fracture of proximal phalanx of left thumb, initial encounter for closed fracture: Secondary | ICD-10-CM | POA: Diagnosis present

## 2015-04-17 DIAGNOSIS — Z91013 Allergy to seafood: Secondary | ICD-10-CM

## 2015-04-17 DIAGNOSIS — S3991XA Unspecified injury of abdomen, initial encounter: Secondary | ICD-10-CM | POA: Diagnosis not present

## 2015-04-17 DIAGNOSIS — S199XXA Unspecified injury of neck, initial encounter: Secondary | ICD-10-CM | POA: Diagnosis not present

## 2015-04-17 DIAGNOSIS — T07XXXA Unspecified multiple injuries, initial encounter: Secondary | ICD-10-CM

## 2015-04-17 DIAGNOSIS — S6992XA Unspecified injury of left wrist, hand and finger(s), initial encounter: Secondary | ICD-10-CM | POA: Diagnosis not present

## 2015-04-17 DIAGNOSIS — M546 Pain in thoracic spine: Secondary | ICD-10-CM | POA: Diagnosis not present

## 2015-04-17 HISTORY — DX: Unspecified asthma, uncomplicated: J45.909

## 2015-04-17 HISTORY — DX: Rider (driver) (passenger) of other motorcycle injured in unspecified traffic accident, initial encounter: V29.99XA

## 2015-04-17 LAB — CBC
HCT: 43.4 % (ref 39.0–52.0)
Hemoglobin: 14 g/dL (ref 13.0–17.0)
MCH: 26.3 pg (ref 26.0–34.0)
MCHC: 32.3 g/dL (ref 30.0–36.0)
MCV: 81.6 fL (ref 78.0–100.0)
PLATELETS: 294 10*3/uL (ref 150–400)
RBC: 5.32 MIL/uL (ref 4.22–5.81)
RDW: 15.2 % (ref 11.5–15.5)
WBC: 15.2 10*3/uL — ABNORMAL HIGH (ref 4.0–10.5)

## 2015-04-17 LAB — COMPREHENSIVE METABOLIC PANEL
ALK PHOS: 62 U/L (ref 38–126)
ALT: 44 U/L (ref 17–63)
AST: 67 U/L — AB (ref 15–41)
Albumin: 3.8 g/dL (ref 3.5–5.0)
Anion gap: 12 (ref 5–15)
BUN: 11 mg/dL (ref 6–20)
CALCIUM: 9 mg/dL (ref 8.9–10.3)
CHLORIDE: 107 mmol/L (ref 101–111)
CO2: 19 mmol/L — AB (ref 22–32)
CREATININE: 1.37 mg/dL — AB (ref 0.61–1.24)
GFR calc Af Amer: >60 mL/min (ref >60)
GFR calc non Af Amer: >60 mL/min (ref >60)
GLUCOSE: 193 mg/dL — AB (ref 65–99)
Potassium: 4.8 mmol/L (ref 3.5–5.1)
SODIUM: 138 mmol/L (ref 135–145)
Total Bilirubin: 1.4 mg/dL — ABNORMAL HIGH (ref 0.3–1.2)
Total Protein: 6.4 g/dL — ABNORMAL LOW (ref 6.5–8.1)

## 2015-04-17 LAB — PROTIME-INR
INR: 1.1 (ref 0.00–1.49)
Prothrombin Time: 14.4 seconds (ref 11.6–15.2)

## 2015-04-17 LAB — CDS SEROLOGY

## 2015-04-17 LAB — ETHANOL

## 2015-04-17 MED ORDER — FENTANYL CITRATE (PF) 100 MCG/2ML IJ SOLN
INTRAMUSCULAR | Status: AC | PRN
  Administered 2015-04-17: 50 ug via INTRAVENOUS

## 2015-04-17 MED ORDER — MORPHINE SULFATE (PF) 4 MG/ML IV SOLN
4.0000 mg | INTRAVENOUS | Status: DC | PRN
  Administered 2015-04-18 (×2): 4 mg via INTRAVENOUS
  Filled 2015-04-17 (×2): qty 1

## 2015-04-17 MED ORDER — OXYCODONE HCL 5 MG PO TABS
10.0000 mg | ORAL_TABLET | ORAL | Status: DC | PRN
  Filled 2015-04-17: qty 2

## 2015-04-17 MED ORDER — OXYCODONE HCL 5 MG PO TABS: 2.5000 mg | ORAL_TABLET | ORAL | Status: DC | PRN

## 2015-04-17 MED ORDER — SILVER SULFADIAZINE 1 % EX CREA
TOPICAL_CREAM | Freq: Every day | CUTANEOUS | Status: DC
  Administered 2015-04-18 – 2015-04-19 (×3): via TOPICAL
  Administered 2015-04-20: 1 via TOPICAL
  Filled 2015-04-17: qty 85
  Filled 2015-04-17: qty 400
  Filled 2015-04-17 (×2): qty 85

## 2015-04-17 MED ORDER — OXYCODONE HCL 5 MG PO TABS: 5.0000 mg | ORAL_TABLET | ORAL | Status: DC | PRN

## 2015-04-17 MED ORDER — MORPHINE SULFATE (PF) 2 MG/ML IV SOLN: 1.0000 mg | INTRAVENOUS | Status: DC | PRN

## 2015-04-17 MED ORDER — IOHEXOL 300 MG/ML  SOLN
100.0000 mL | Freq: Once | INTRAMUSCULAR | Status: AC | PRN
  Administered 2015-04-17: 100 mL via INTRAVENOUS

## 2015-04-17 MED ORDER — POTASSIUM CHLORIDE IN NACL 20-0.9 MEQ/L-% IV SOLN
INTRAVENOUS | Status: DC
  Administered 2015-04-18: via INTRAVENOUS
  Filled 2015-04-17 (×2): qty 1000

## 2015-04-17 MED ORDER — ENOXAPARIN SODIUM 40 MG/0.4ML ~~LOC~~ SOLN
40.0000 mg | Freq: Every day | SUBCUTANEOUS | Status: DC
  Administered 2015-04-18: 40 mg via SUBCUTANEOUS
  Filled 2015-04-17: qty 0.4

## 2015-04-17 MED ORDER — FENTANYL CITRATE (PF) 100 MCG/2ML IJ SOLN
INTRAMUSCULAR | Status: AC
  Filled 2015-04-17: qty 2

## 2015-04-17 MED ORDER — HYDROMORPHONE HCL 1 MG/ML IJ SOLN
1.0000 mg | Freq: Once | INTRAMUSCULAR | Status: AC
  Administered 2015-04-17: 1 mg via INTRAVENOUS

## 2015-04-17 MED ORDER — HYDROMORPHONE HCL 1 MG/ML IJ SOLN
1.0000 mg | INTRAMUSCULAR | Status: DC | PRN
  Administered 2015-04-18 (×2): 1 mg via INTRAVENOUS
  Filled 2015-04-17 (×2): qty 1

## 2015-04-17 MED ORDER — MORPHINE SULFATE (PF) 2 MG/ML IV SOLN
2.0000 mg | INTRAVENOUS | Status: DC | PRN
  Administered 2015-04-18 (×2): 2 mg via INTRAVENOUS
  Filled 2015-04-17 (×2): qty 1

## 2015-04-17 MED ORDER — ONDANSETRON HCL 4 MG PO TABS: 4.0000 mg | ORAL_TABLET | Freq: Four times a day (QID) | ORAL | Status: DC | PRN

## 2015-04-17 MED ORDER — ONDANSETRON HCL 4 MG/2ML IJ SOLN
4.0000 mg | Freq: Four times a day (QID) | INTRAMUSCULAR | Status: DC | PRN
  Administered 2015-04-18: 4 mg via INTRAVENOUS
  Filled 2015-04-17: qty 2

## 2015-04-17 MED ORDER — HYDROMORPHONE HCL 1 MG/ML IJ SOLN
INTRAMUSCULAR | Status: AC
  Filled 2015-04-17: qty 1

## 2015-04-17 NOTE — Progress Notes (Signed)
Orthopedic Tech Progress Note Patient Details:  Alexis Chapman 01/10/1976 [id number] Level 2 trauma ortho visit. Patient ID: Alexis Chapman, male   DOB: 04/29/1975, 40 y.o.   MRN: [id number]   Jennye MoccasinHughes, [name] 04/17/2015, 9:46 PM
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
## 2015-04-17 NOTE — ED Notes (Signed)
Escorted to CT by this RN 

## 2015-04-17 NOTE — ED Notes (Signed)
Ccollar and pelvic binder removed per Dr. Magnus IvanBlackman
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
## 2015-04-18 ENCOUNTER — Encounter (HOSPITAL_COMMUNITY): Payer: Self-pay | Admitting: General Practice

## 2015-04-18 DIAGNOSIS — S30810A Abrasion of lower back and pelvis, initial encounter: Secondary | ICD-10-CM | POA: Diagnosis present

## 2015-04-18 DIAGNOSIS — S1093XA Contusion of unspecified part of neck, initial encounter: Secondary | ICD-10-CM | POA: Diagnosis present

## 2015-04-18 DIAGNOSIS — S62308A Unspecified fracture of other metacarpal bone, initial encounter for closed fracture: Secondary | ICD-10-CM | POA: Diagnosis present

## 2015-04-18 DIAGNOSIS — S62512A Displaced fracture of proximal phalanx of left thumb, initial encounter for closed fracture: Secondary | ICD-10-CM | POA: Diagnosis present

## 2015-04-18 DIAGNOSIS — S43101A Unspecified dislocation of right acromioclavicular joint, initial encounter: Secondary | ICD-10-CM | POA: Diagnosis present

## 2015-04-18 DIAGNOSIS — S62304A Unspecified fracture of fourth metacarpal bone, right hand, initial encounter for closed fracture: Secondary | ICD-10-CM | POA: Diagnosis present

## 2015-04-18 DIAGNOSIS — T07 Unspecified multiple injuries: Secondary | ICD-10-CM | POA: Diagnosis not present

## 2015-04-18 DIAGNOSIS — F17210 Nicotine dependence, cigarettes, uncomplicated: Secondary | ICD-10-CM | POA: Diagnosis present

## 2015-04-18 DIAGNOSIS — S40211A Abrasion of right shoulder, initial encounter: Secondary | ICD-10-CM | POA: Diagnosis present

## 2015-04-18 DIAGNOSIS — S80812A Abrasion, left lower leg, initial encounter: Secondary | ICD-10-CM | POA: Diagnosis present

## 2015-04-18 DIAGNOSIS — Z880 Allergy status to penicillin: Secondary | ICD-10-CM | POA: Diagnosis not present

## 2015-04-18 DIAGNOSIS — S62324A Displaced fracture of shaft of fourth metacarpal bone, right hand, initial encounter for closed fracture: Secondary | ICD-10-CM | POA: Diagnosis not present

## 2015-04-18 DIAGNOSIS — S20211A Contusion of right front wall of thorax, initial encounter: Secondary | ICD-10-CM | POA: Diagnosis not present

## 2015-04-18 DIAGNOSIS — S62394A Other fracture of fourth metacarpal bone, right hand, initial encounter for closed fracture: Secondary | ICD-10-CM | POA: Diagnosis not present

## 2015-04-18 DIAGNOSIS — J45909 Unspecified asthma, uncomplicated: Secondary | ICD-10-CM | POA: Diagnosis present

## 2015-04-18 DIAGNOSIS — Z91013 Allergy to seafood: Secondary | ICD-10-CM | POA: Diagnosis not present

## 2015-04-18 DIAGNOSIS — S30811A Abrasion of abdominal wall, initial encounter: Secondary | ICD-10-CM | POA: Diagnosis present

## 2015-04-18 LAB — SAMPLE TO BLOOD BANK

## 2015-04-18 MED ORDER — ACETAMINOPHEN 325 MG PO TABS
650.0000 mg | ORAL_TABLET | Freq: Four times a day (QID) | ORAL | Status: DC | PRN
  Administered 2015-04-19 (×2): 650 mg via ORAL
  Filled 2015-04-18 (×2): qty 2

## 2015-04-18 MED ORDER — METHOCARBAMOL 500 MG PO TABS
1000.0000 mg | ORAL_TABLET | Freq: Three times a day (TID) | ORAL | Status: DC
  Administered 2015-04-18 – 2015-04-20 (×8): 1000 mg via ORAL
  Filled 2015-04-18 (×8): qty 2

## 2015-04-18 MED ORDER — DOUBLE ANTIBIOTIC 500-10000 UNIT/GM EX OINT
TOPICAL_OINTMENT | Freq: Two times a day (BID) | CUTANEOUS | Status: DC
  Administered 2015-04-18 – 2015-04-19 (×3): via TOPICAL
  Filled 2015-04-18: qty 28.35
  Filled 2015-04-18 (×18): qty 1

## 2015-04-18 MED ORDER — OXYCODONE HCL 5 MG PO TABS
5.0000 mg | ORAL_TABLET | ORAL | Status: DC | PRN
  Administered 2015-04-19: 15 mg via ORAL
  Administered 2015-04-19 (×3): 10 mg via ORAL
  Administered 2015-04-20 (×4): 15 mg via ORAL
  Filled 2015-04-18 (×3): qty 3
  Filled 2015-04-18 (×2): qty 2
  Filled 2015-04-18 (×2): qty 3

## 2015-04-18 MED ORDER — ENSURE ENLIVE PO LIQD
237.0000 mL | Freq: Three times a day (TID) | ORAL | Status: DC
  Administered 2015-04-18 – 2015-04-20 (×6): 237 mL via ORAL

## 2015-04-18 MED ORDER — MORPHINE SULFATE (PF) 2 MG/ML IV SOLN
1.0000 mg | INTRAVENOUS | Status: DC | PRN
  Administered 2015-04-18 – 2015-04-19 (×7): 4 mg via INTRAVENOUS
  Filled 2015-04-18 (×7): qty 2

## 2015-04-18 NOTE — Progress Notes (Signed)
Orthopedic Tech Progress Note Patient Details:  Alexis RossettiCornell Chapman 06/11/1975 [id number]  Ortho Devices Type of Ortho Device: Ace wrap, Volar splint Ortho Device/Splint Location: (B) UE Ortho Device/Splint Interventions: Ordered, Application   Jennye MoccasinHughes, [name] 04/18/2015, 8:46 PM
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
## 2015-04-18 NOTE — Care Management Obs Status (Signed)
MEDICARE OBSERVATION STATUS NOTIFICATION   Patient Details  Name: Alexis Chapman MRN: [id number] Date of Birth: 05/13/1975   Medicare Observation Status Notification Given:  Yes    Glennon Macmerson, Rosezella Kronick M, RN 04/18/2015, 11:56 AM

## 2015-04-18 NOTE — Care Management Obs Status (Deleted)
MEDICARE OBSERVATION STATUS NOTIFICATION   Patient Details  Name: Mcarthur RossettiCornell Marple MRN: [id number] Date of Birth: 02/05/1975   Medicare Observation Status Notification Given:       Glennon Macmerson, Kebrina Friend M, RN 04/18/2015, 11:55 AM
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
## 2015-04-18 NOTE — Progress Notes (Signed)
The patient was getting his dressing changes and the nurse and the patient noticed a large bulge in the front of his right shoulder around his clavicle.  The bulge is gone when he is lying back.  He has a lot of tenderness to the clavicle, AC joint, and surrounding soft tissues.  I suspect he has a high grade AC separation/ligamentous tear there which is why when he moves it "pops out".  I left a message with Arlys JohnBrian, Dr. Carlos LeveringGramig's PA to see if he will see him for that as well.  I do not suspect a vascular injury at this point or clavicle fracture as none was seen on CT chest.  Jorje GuildMegan Madi Bonfiglio, PA-C General Surgery [facility] [phone]
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
## 2015-04-19 DIAGNOSIS — S43101A Unspecified dislocation of right acromioclavicular joint, initial encounter: Secondary | ICD-10-CM | POA: Diagnosis present

## 2015-04-19 LAB — BASIC METABOLIC PANEL
ANION GAP: 10 (ref 5–15)
BUN: 9 mg/dL (ref 6–20)
CHLORIDE: 105 mmol/L (ref 101–111)
CO2: 21 mmol/L — AB (ref 22–32)
CREATININE: 1.14 mg/dL (ref 0.61–1.24)
Calcium: 8.9 mg/dL (ref 8.9–10.3)
GFR calc non Af Amer: >60 mL/min (ref >60)
Glucose, Bld: 157 mg/dL — ABNORMAL HIGH (ref 65–99)
POTASSIUM: 4.3 mmol/L (ref 3.5–5.1)
SODIUM: 136 mmol/L (ref 135–145)

## 2015-04-19 LAB — CBC
HEMATOCRIT: 41.4 % (ref 39.0–52.0)
HEMOGLOBIN: 13.9 g/dL (ref 13.0–17.0)
MCH: 27.1 pg (ref 26.0–34.0)
MCHC: 33.6 g/dL (ref 30.0–36.0)
MCV: 80.7 fL (ref 78.0–100.0)
Platelets: 232 10*3/uL (ref 150–400)
RBC: 5.13 MIL/uL (ref 4.22–5.81)
RDW: 15.4 % (ref 11.5–15.5)
WBC: 10.9 10*3/uL — AB (ref 4.0–10.5)

## 2015-04-19 MED ORDER — ENOXAPARIN SODIUM 30 MG/0.3ML ~~LOC~~ SOLN
30.0000 mg | Freq: Two times a day (BID) | SUBCUTANEOUS | Status: DC
  Administered 2015-04-19 – 2015-04-20 (×3): 30 mg via SUBCUTANEOUS
  Filled 2015-04-19 (×3): qty 0.3

## 2015-04-19 MED ORDER — MORPHINE SULFATE (PF) 2 MG/ML IV SOLN
2.0000 mg | INTRAVENOUS | Status: DC | PRN
  Administered 2015-04-19 – 2015-04-20 (×4): 2 mg via INTRAVENOUS
  Filled 2015-04-19 (×4): qty 1

## 2015-04-19 MED ORDER — POLYETHYLENE GLYCOL 3350 17 G PO PACK
17.0000 g | PACK | Freq: Every day | ORAL | Status: DC
  Administered 2015-04-19 – 2015-04-20 (×2): 17 g via ORAL
  Filled 2015-04-19 (×2): qty 1

## 2015-04-19 MED ORDER — DOCUSATE SODIUM 100 MG PO CAPS
100.0000 mg | ORAL_CAPSULE | Freq: Two times a day (BID) | ORAL | Status: DC
  Administered 2015-04-19 – 2015-04-20 (×3): 100 mg via ORAL
  Filled 2015-04-19 (×3): qty 1

## 2015-04-19 NOTE — Progress Notes (Signed)
Dressing changes done to left/right back; left/right buttocks, left leg, right shoulder, and abdomen. Patient is refusing sling for right shoulder.
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
## 2015-04-19 NOTE — Progress Notes (Signed)
Subjective: Patient reports hands are feeling better. Main complaints are continued right shoulder pain and back pain. Denies N/V/F/C/SOB/CP  Objective: Vital signs in last 24 hours: Temp:  [99.2 F (37.3 C)-99.7 F (37.6 C)] 99.2 F (37.3 C) (03/29 0500) Pulse Rate:  [79-87] 87 (03/29 0500) Resp:  [20] 20 (03/29 0500) BP: (151-152)/(74-84) 152/74 mmHg (03/29 0500) SpO2:  [94 %-98 %] 94 % (03/29 0500)  Intake/Output from previous day: 03/28 0701 - 03/29 0700 In: -  Out: 1 [Urine:1] Intake/Output this shift: Total I/O In: -  Out: 1 [Urine:1]   Recent Labs  04/17/15 2140 04/19/15 0415  HGB 14.0 13.9    Recent Labs  04/17/15 2140 04/19/15 0415  WBC 15.2* 10.9*  RBC 5.32 5.13  HCT 43.4 41.4  PLT 294 232    Recent Labs  04/17/15 2140 04/19/15 0415  NA 138 136  K 4.8 4.3  CL 107 105  CO2 19* 21*  BUN 11 9  CREATININE 1.37* 1.14  GLUCOSE 193* 157*  CALCIUM 9.0 8.9    Recent Labs  04/17/15 2140  INR 1.10    Bilateral Hand splints are clean and dry. Abrasions about forearms are uncomplicated. Digital rom improved compared to yesterday. Sensation and refill intact.  Assessment/Plan: Will continue splint immobilization and will plan to check hand abrasions over the next few days. Patient stable per hand surgery. All questions were encouraged and answered.   Alexis Chapman L 04/19/2015, 4:58 PM
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
## 2015-04-20 MED ORDER — SULFAMETHOXAZOLE-TRIMETHOPRIM 800-160 MG PO TABS
1.0000 | ORAL_TABLET | Freq: Once | ORAL | Status: AC
  Administered 2015-04-20: 1 via ORAL
  Filled 2015-04-20: qty 1

## 2015-04-20 MED ORDER — SULFAMETHOXAZOLE-TRIMETHOPRIM 800-160 MG PO TABS: 1.0000 | ORAL_TABLET | Freq: Two times a day (BID) | ORAL | Status: DC

## 2015-04-20 MED ORDER — METHOCARBAMOL 500 MG PO TABS: 1000.0000 mg | ORAL_TABLET | Freq: Four times a day (QID) | ORAL | Status: DC | PRN

## 2015-04-20 MED ORDER — OXYCODONE-ACETAMINOPHEN 7.5-325 MG PO TABS: 1.0000 | ORAL_TABLET | ORAL | Status: DC | PRN

## 2015-04-20 MED ORDER — SILVER SULFADIAZINE 1 % EX CREA: TOPICAL_CREAM | Freq: Every day | CUTANEOUS | Status: DC
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
## 2015-04-20 NOTE — Progress Notes (Signed)
Alexis Chapman to be D/C'd Home per MD order. Discussed with the patient and all questions fully answered.    Medication List    TAKE these medications        albuterol (2.5 MG/3ML) 0.083% nebulizer solution  Commonly known as:  PROVENTIL  Take 2.5 mg by nebulization every 6 (six) hours as needed for wheezing or shortness of breath.     methocarbamol 500 MG tablet  Commonly known as:  ROBAXIN  Take 2 tablets (1,000 mg total) by mouth every 6 (six) hours as needed for muscle spasms.     oxyCODONE-acetaminophen 7.5-325 MG tablet  Commonly known as:  PERCOCET  Take 1-2 tablets by mouth every 4 (four) hours as needed.     silver sulfADIAZINE 1 % cream  Commonly known as:  SILVADENE  Apply topically daily.     sulfamethoxazole-trimethoprim 800-160 MG tablet  Commonly known as:  BACTRIM DS,SEPTRA DS  Take 1 tablet by mouth every 12 (twelve) hours.        VVS, Skin clean, dry and intact without evidence of skin break down, no evidence of skin tears noted.  IV catheter discontinued intact. Site without signs and symptoms of complications. Dressing and pressure applied.  An After Visit Summary was printed and given to the patient.  Patient escorted via WC, and D/C home via private auto.  Alexis Chapman, Alexis Chapman  04/20/2015 6:41 PM
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
## 2015-04-20 NOTE — Care Management Note (Signed)
Case Management Note  Patient Details  Name: Alexis Chapman MRN: [id number] Date of Birth: 10/05/1975  Subjective/Objective:   Pt medically stable for dc home today with wife/family.                 Action/Plan: [facility] [place] [place] for PT/OT follow up.    Expected Discharge Date:     04/20/15             Expected Discharge Plan:  Home w Home Health Services  In-House Referral:     Discharge planning Services  CM Consult  Post Acute Care Choice:    Choice offered to:     DME Arranged:    DME Agency:     HH Arranged:    HH Agency:     Status of Service:  Completed, signed off  Medicare Important Message Given:    Date Medicare IM Given:    Medicare IM give by:    Date Additional Medicare IM Given:    Additional Medicare Important Message give by:     If discussed at Long Length of Stay Meetings, dates discussed:    Additional Comments:  Quintella BatonJulie W. Carmita Boom, RN, BSN  Trauma/Neuro ICU Case Manager [phone]
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
## 2015-04-20 NOTE — Discharge Instructions (Signed)
Wash wounds daily in shower with soap and water. °Do not soak. °Apply antibiotic ointment (e.g. Neosporin) twice daily and as needed to keep moist. °Cover with dry dressing. ° °No driving while taking oxycodone. °
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
## 2015-04-21 ENCOUNTER — Telehealth (HOSPITAL_COMMUNITY): Payer: Self-pay

## 2015-04-21 ENCOUNTER — Encounter (HOSPITAL_BASED_OUTPATIENT_CLINIC_OR_DEPARTMENT_OTHER): Payer: Self-pay | Admitting: Emergency Medicine

## 2015-04-21 ENCOUNTER — Encounter: Payer: Self-pay | Admitting: Orthopedic Surgery

## 2015-04-21 NOTE — Telephone Encounter (Signed)
Wrote letter detailing his stay in the hospital.

## 2015-05-02 DIAGNOSIS — S20211D Contusion of right front wall of thorax, subsequent encounter: Secondary | ICD-10-CM | POA: Diagnosis not present

## 2015-05-03 DIAGNOSIS — S31109A Unspecified open wound of abdominal wall, unspecified quadrant without penetration into peritoneal cavity, initial encounter: Secondary | ICD-10-CM | POA: Diagnosis not present

## 2015-05-04 DIAGNOSIS — S62344D Nondisplaced fracture of base of fourth metacarpal bone, right hand, subsequent encounter for fracture with routine healing: Secondary | ICD-10-CM | POA: Diagnosis not present

## 2015-05-18 DIAGNOSIS — S62324D Displaced fracture of shaft of fourth metacarpal bone, right hand, subsequent encounter for fracture with routine healing: Secondary | ICD-10-CM | POA: Diagnosis not present

## 2015-05-18 DIAGNOSIS — S62512D Displaced fracture of proximal phalanx of left thumb, subsequent encounter for fracture with routine healing: Secondary | ICD-10-CM | POA: Diagnosis not present

## 2015-05-18 DIAGNOSIS — S63642D Sprain of metacarpophalangeal joint of left thumb, subsequent encounter: Secondary | ICD-10-CM | POA: Diagnosis not present

## 2015-05-23 ENCOUNTER — Encounter (HOSPITAL_BASED_OUTPATIENT_CLINIC_OR_DEPARTMENT_OTHER): Payer: Self-pay

## 2015-05-23 DIAGNOSIS — J45909 Unspecified asthma, uncomplicated: Secondary | ICD-10-CM | POA: Diagnosis not present

## 2015-05-23 DIAGNOSIS — F17210 Nicotine dependence, cigarettes, uncomplicated: Secondary | ICD-10-CM | POA: Diagnosis not present

## 2015-05-23 DIAGNOSIS — R109 Unspecified abdominal pain: Secondary | ICD-10-CM | POA: Diagnosis not present

## 2015-05-23 DIAGNOSIS — K5900 Constipation, unspecified: Secondary | ICD-10-CM | POA: Diagnosis not present

## 2015-05-23 DIAGNOSIS — M545 Low back pain: Secondary | ICD-10-CM | POA: Diagnosis not present

## 2015-05-23 DIAGNOSIS — K5909 Other constipation: Secondary | ICD-10-CM | POA: Diagnosis not present

## 2015-05-23 DIAGNOSIS — S3991XA Unspecified injury of abdomen, initial encounter: Secondary | ICD-10-CM | POA: Diagnosis not present

## 2015-05-23 DIAGNOSIS — M62830 Muscle spasm of back: Secondary | ICD-10-CM | POA: Diagnosis not present
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
## 2015-05-24 ENCOUNTER — Encounter (HOSPITAL_BASED_OUTPATIENT_CLINIC_OR_DEPARTMENT_OTHER): Payer: Self-pay | Admitting: Emergency Medicine

## 2015-05-24 ENCOUNTER — Emergency Department (HOSPITAL_BASED_OUTPATIENT_CLINIC_OR_DEPARTMENT_OTHER): Payer: Medicare Other

## 2015-05-24 ENCOUNTER — Emergency Department (HOSPITAL_BASED_OUTPATIENT_CLINIC_OR_DEPARTMENT_OTHER)
Admission: EM | Admit: 2015-05-24 | Discharge: 2015-05-24 | Disposition: A | Discharge status: Home / Self Care | Payer: Medicare Other | Attending: Emergency Medicine | Admitting: Emergency Medicine

## 2015-05-24 DIAGNOSIS — S3991XA Unspecified injury of abdomen, initial encounter: Secondary | ICD-10-CM | POA: Diagnosis not present

## 2015-05-24 DIAGNOSIS — R109 Unspecified abdominal pain: Secondary | ICD-10-CM | POA: Diagnosis not present

## 2015-05-24 DIAGNOSIS — M62830 Muscle spasm of back: Secondary | ICD-10-CM

## 2015-05-24 DIAGNOSIS — K5909 Other constipation: Secondary | ICD-10-CM

## 2015-05-24 LAB — URINALYSIS, ROUTINE W REFLEX MICROSCOPIC
Bilirubin Urine: NEGATIVE
GLUCOSE, UA: NEGATIVE mg/dL
Hgb urine dipstick: NEGATIVE
Ketones, ur: NEGATIVE mg/dL
LEUKOCYTES UA: NEGATIVE
Nitrite: NEGATIVE
PH: 6 (ref 5.0–8.0)
PROTEIN: NEGATIVE mg/dL
SPECIFIC GRAVITY, URINE: 1.028 (ref 1.005–1.030)

## 2015-05-24 MED ORDER — NAPROXEN 375 MG PO TABS: 375.0000 mg | ORAL_TABLET | Freq: Two times a day (BID) | ORAL | Status: AC

## 2015-05-24 MED ORDER — KETOROLAC TROMETHAMINE 60 MG/2ML IM SOLN
60.0000 mg | Freq: Once | INTRAMUSCULAR | Status: AC
  Administered 2015-05-24: 60 mg via INTRAMUSCULAR
  Filled 2015-05-24: qty 2

## 2015-05-24 MED ORDER — LIDOCAINE 5 % EX PTCH: 1.0000 | MEDICATED_PATCH | CUTANEOUS | Status: AC

## 2015-05-24 MED ORDER — POLYETHYLENE GLYCOL 3350 17 GM/SCOOP PO POWD: 17.0000 g | Freq: Every day | ORAL | Status: AC

## 2015-05-24 NOTE — ED Notes (Signed)
MD at bedside. 

## 2015-05-24 NOTE — Discharge Instructions (Signed)
Back Exercises °The following exercises strengthen the muscles that help to support the back. They also help to keep the lower back flexible. Doing these exercises can help to prevent back pain or lessen existing pain. °If you have back pain or discomfort, try doing these exercises 2-3 times each day or as told by your health care provider. When the pain goes away, do them once each day, but increase the number of times that you repeat the steps for each exercise (do more repetitions). If you do not have back pain or discomfort, do these exercises once each day or as told by your health care provider. °EXERCISES °Single Knee to Chest °Repeat these steps 3-5 times for each leg: °· Lie on your back on a firm bed or the floor with your legs extended. °· Bring one knee to your chest. Your other leg should stay extended and in contact with the floor. °· Hold your knee in place by grabbing your knee or thigh. °· Pull on your knee until you feel a gentle stretch in your lower back. °· Hold the stretch for 10-30 seconds. °· Slowly release and straighten your leg. °Pelvic Tilt °Repeat these steps 5-10 times: °· Lie on your back on a firm bed or the floor with your legs extended. °· Bend your knees so they are pointing toward the ceiling and your feet are flat on the floor. °· Tighten your lower abdominal muscles to press your lower back against the floor. This motion will tilt your pelvis so your tailbone points up toward the ceiling instead of pointing to your feet or the floor. °· With gentle tension and even breathing, hold this position for 5-10 seconds. °Cat-Cow °Repeat these steps until your lower back becomes more flexible: °· Get into a hands-and-knees position on a firm surface. Keep your hands under your shoulders, and keep your knees under your hips. You may place padding under your knees for comfort. °· Let your head hang down, and point your tailbone toward the floor so your lower back becomes rounded like the  back of a cat. °· Hold this position for 5 seconds. °· Slowly lift your head and point your tailbone up toward the ceiling so your back forms a sagging arch like the back of a cow. °· Hold this position for 5 seconds. °Press-Ups °Repeat these steps 5-10 times: °· Lie on your abdomen (face-down) on the floor. °· Place your palms near your head, about shoulder-width apart. °· While you keep your back as relaxed as possible and keep your hips on the floor, slowly straighten your arms to raise the top half of your body and lift your shoulders. Do not use your back muscles to raise your upper torso. You may adjust the placement of your hands to make yourself more comfortable. °· Hold this position for 5 seconds while you keep your back relaxed. °· Slowly return to lying flat on the floor. °Bridges °Repeat these steps 10 times: °· Lie on your back on a firm surface. °· Bend your knees so they are pointing toward the ceiling and your feet are flat on the floor. °· Tighten your buttocks muscles and lift your buttocks off of the floor until your waist is at almost the same height as your knees. You should feel the muscles working in your buttocks and the back of your thighs. If you do not feel these muscles, slide your feet 1-2 inches farther away from your buttocks. °· Hold this position for 3-5   seconds.  Slowly lower your hips to the starting position, and allow your buttocks muscles to relax completely. If this exercise is too easy, try doing it with your arms crossed over your chest. Abdominal Crunches Repeat these steps 5-10 times:  Lie on your back on a firm bed or the floor with your legs extended.  Bend your knees so they are pointing toward the ceiling and your feet are flat on the floor.  Cross your arms over your chest.  Tip your chin slightly toward your chest without bending your neck.  Tighten your abdominal muscles and slowly raise your trunk (torso) high enough to lift your shoulder blades a  tiny bit off of the floor. Avoid raising your torso higher than that, because it can put too much stress on your low back and it does not help to strengthen your abdominal muscles.  Slowly return to your starting position. Back Lifts Repeat these steps 5-10 times: 1. Lie on your abdomen (face-down) with your arms at your sides, and rest your forehead on the floor. 2. Tighten the muscles in your legs and your buttocks. 3. Slowly lift your chest off of the floor while you keep your hips pressed to the floor. Keep the back of your head in line with the curve in your back. Your eyes should be looking at the floor. 4. Hold this position for 3-5 seconds. 5. Slowly return to your starting position. SEEK MEDICAL CARE IF:  Your back pain or discomfort gets much worse when you do an exercise.  Your back pain or discomfort does not lessen within 2 hours after you exercise. If you have any of these problems, stop doing these exercises right away. Do not do them again unless your health care provider says that you can. SEEK IMMEDIATE MEDICAL CARE IF:  You develop sudden, severe back pain. If this happens, stop doing the exercises right away. Do not do them again unless your health care provider says that you can.   This information is not intended to replace advice given to you by your health care provider. Make sure you discuss any questions you have with your health care provider.   Document Released: 02/15/2004 Document Revised: 09/28/2014 Document Reviewed: 03/03/2014 Elsevier Interactive Patient Education 2016 [employer]  Constipation, Adult Constipation is when a person has fewer than three bowel movements a week, has difficulty having a bowel movement, or has stools that are dry, hard, or larger than normal. As people grow older, constipation is more common. A low-fiber diet, not taking in enough fluids, and taking certain medicines may make constipation worse.  CAUSES   Certain medicines,  such as antidepressants, pain medicine, iron supplements, antacids, and water pills.   Certain diseases, such as diabetes, irritable bowel syndrome (IBS), thyroid disease, or depression.   Not drinking enough water.   Not eating enough fiber-rich foods.   Stress or travel.   Lack of physical activity or exercise.   Ignoring the urge to have a bowel movement.   Using laxatives too much.  SIGNS AND SYMPTOMS   Having fewer than three bowel movements a week.   Straining to have a bowel movement.   Having stools that are hard, dry, or larger than normal.   Feeling full or bloated.   Pain in the lower abdomen.   Not feeling relief after having a bowel movement.  DIAGNOSIS  Your health care provider will take a medical history and perform a physical exam. Further testing may be  for severe constipation. Some tests may include: °· A barium enema X-ray to examine your rectum, colon, and, sometimes, your small intestine.   °· A sigmoidoscopy to examine your lower colon.   °· A colonoscopy to examine your entire colon. °TREATMENT  °Treatment will depend on the severity of your constipation and what is causing it. Some dietary treatments include drinking more fluids and eating more fiber-rich foods. Lifestyle treatments may include regular exercise. If these diet and lifestyle recommendations do not help, your health care provider may recommend taking over-the-counter laxative medicines to help you have bowel movements. Prescription medicines may be prescribed if over-the-counter medicines do not work.  °HOME CARE INSTRUCTIONS  °· Eat foods that have a lot of fiber, such as fruits, vegetables, whole grains, and beans. °· Limit foods high in fat and processed sugars, such as french fries, hamburgers, cookies, candies, and soda.   °· A fiber supplement may be added to your diet if you cannot get enough fiber from foods.   °· Drink enough fluids to keep your urine clear or pale  yellow.   °· Exercise regularly or as directed by your health care provider.   °· Go to the restroom when you have the urge to go. Do not hold it.   °· Only take over-the-counter or prescription medicines as directed by your health care provider. Do not take other medicines for constipation without talking to your health care provider first.   °SEEK IMMEDIATE MEDICAL CARE IF:  °· You have bright red blood in your stool.   °· Your constipation lasts for more than 4 days or gets worse.   °· You have abdominal or rectal pain.   °· You have thin, pencil-like stools.   °· You have unexplained weight loss. °MAKE SURE YOU:  °· Understand these instructions. °· Will watch your condition. °· Will get help right away if you are not doing well or get worse. °  °This information is not intended to replace advice given to you by your health care provider. Make sure you discuss any questions you have with your health care provider. °  °Document Released: 10/06/2003 Document Revised: 01/28/2014 Document Reviewed: 10/19/2012 °Elsevier Interactive Patient Education ©2016 [employer]vier Inc. ° °
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
## 2015-11-21 DIAGNOSIS — Q829 Congenital malformation of skin, unspecified: Secondary | ICD-10-CM | POA: Diagnosis not present

## 2015-11-21 DIAGNOSIS — Z131 Encounter for screening for diabetes mellitus: Secondary | ICD-10-CM | POA: Diagnosis not present

## 2015-11-21 DIAGNOSIS — J45909 Unspecified asthma, uncomplicated: Secondary | ICD-10-CM | POA: Diagnosis not present

## 2015-11-21 DIAGNOSIS — Z1810 Retained metal fragments, unspecified: Secondary | ICD-10-CM | POA: Diagnosis not present

## 2015-11-21 DIAGNOSIS — R5383 Other fatigue: Secondary | ICD-10-CM | POA: Diagnosis not present

## 2015-11-21 DIAGNOSIS — H539 Unspecified visual disturbance: Secondary | ICD-10-CM | POA: Diagnosis not present

## 2015-11-21 DIAGNOSIS — M539 Dorsopathy, unspecified: Secondary | ICD-10-CM | POA: Diagnosis not present

## 2015-11-21 DIAGNOSIS — Q899 Congenital malformation, unspecified: Secondary | ICD-10-CM | POA: Diagnosis not present
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
## 2017-02-24 DIAGNOSIS — R05 Cough: Secondary | ICD-10-CM | POA: Diagnosis not present

## 2017-02-24 DIAGNOSIS — Z01118 Encounter for examination of ears and hearing with other abnormal findings: Secondary | ICD-10-CM | POA: Diagnosis not present

## 2017-02-24 DIAGNOSIS — Z5181 Encounter for therapeutic drug level monitoring: Secondary | ICD-10-CM | POA: Diagnosis not present

## 2017-02-24 DIAGNOSIS — Z136 Encounter for screening for cardiovascular disorders: Secondary | ICD-10-CM | POA: Diagnosis not present

## 2017-02-24 DIAGNOSIS — Z113 Encounter for screening for infections with a predominantly sexual mode of transmission: Secondary | ICD-10-CM | POA: Diagnosis not present

## 2017-02-24 DIAGNOSIS — J45909 Unspecified asthma, uncomplicated: Secondary | ICD-10-CM | POA: Diagnosis not present

## 2017-02-24 DIAGNOSIS — Z131 Encounter for screening for diabetes mellitus: Secondary | ICD-10-CM | POA: Diagnosis not present

## 2017-02-24 DIAGNOSIS — E785 Hyperlipidemia, unspecified: Secondary | ICD-10-CM | POA: Diagnosis not present

## 2017-02-24 DIAGNOSIS — E559 Vitamin D deficiency, unspecified: Secondary | ICD-10-CM | POA: Diagnosis not present

## 2017-08-05 ENCOUNTER — Emergency Department (HOSPITAL_BASED_OUTPATIENT_CLINIC_OR_DEPARTMENT_OTHER)
Admission: EM | Admit: 2017-08-05 | Discharge: 2017-08-05 | Disposition: A | Discharge status: Home / Self Care | Payer: Medicare Other | Attending: Emergency Medicine | Admitting: Emergency Medicine

## 2017-08-05 ENCOUNTER — Encounter (HOSPITAL_BASED_OUTPATIENT_CLINIC_OR_DEPARTMENT_OTHER): Payer: Self-pay

## 2017-08-05 ENCOUNTER — Other Ambulatory Visit: Payer: Self-pay

## 2017-08-05 DIAGNOSIS — Z79899 Other long term (current) drug therapy: Secondary | ICD-10-CM | POA: Insufficient documentation

## 2017-08-05 DIAGNOSIS — F17210 Nicotine dependence, cigarettes, uncomplicated: Secondary | ICD-10-CM | POA: Diagnosis not present

## 2017-08-05 DIAGNOSIS — J45909 Unspecified asthma, uncomplicated: Secondary | ICD-10-CM | POA: Diagnosis not present

## 2017-08-05 DIAGNOSIS — M542 Cervicalgia: Secondary | ICD-10-CM | POA: Diagnosis present

## 2017-08-05 DIAGNOSIS — M62838 Other muscle spasm: Secondary | ICD-10-CM | POA: Diagnosis not present

## 2017-08-05 MED ORDER — DIAZEPAM 5 MG PO TABS
5.0000 mg | ORAL_TABLET | Freq: Once | ORAL | Status: AC
  Administered 2017-08-05: 5 mg via ORAL
  Filled 2017-08-05: qty 1

## 2017-08-05 MED ORDER — KETOROLAC TROMETHAMINE 15 MG/ML IJ SOLN
15.0000 mg | Freq: Once | INTRAMUSCULAR | Status: AC
  Administered 2017-08-05: 15 mg via INTRAMUSCULAR
  Filled 2017-08-05: qty 1

## 2017-08-05 MED ORDER — ACETAMINOPHEN 500 MG PO TABS
1000.0000 mg | ORAL_TABLET | Freq: Once | ORAL | Status: AC
  Administered 2017-08-05: 1000 mg via ORAL
  Filled 2017-08-05: qty 2

## 2017-08-05 MED ORDER — OXYCODONE HCL 5 MG PO TABS
5.0000 mg | ORAL_TABLET | Freq: Once | ORAL | Status: AC
  Administered 2017-08-05: 5 mg via ORAL
  Filled 2017-08-05: qty 1

## 2017-08-05 MED ORDER — DIAZEPAM 5 MG PO TABS: 5.0000 mg | ORAL_TABLET | Freq: Four times a day (QID) | ORAL | 0 refills | Status: AC | PRN

## 2017-08-05 MED FILL — diazePAM 5 MG TABS: 5 | 2 days supply | Qty: 5 | Fill #0

## 2017-08-05 NOTE — Discharge Instructions (Signed)
Take 4 over the counter ibuprofen tablets 3 times a day or 2 over-the-counter naproxen tablets twice a day for pain. Also take tylenol 1000mg(2 extra strength) four times a day.    
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
## 2017-08-05 NOTE — ED Triage Notes (Signed)
Pt c/o rt sided neck pain. States was lifting a pt off the floor last night at work and felt a pop.

## 2017-08-11 ENCOUNTER — Emergency Department (HOSPITAL_BASED_OUTPATIENT_CLINIC_OR_DEPARTMENT_OTHER)
Admission: EM | Admit: 2017-08-11 | Discharge: 2017-08-11 | Disposition: A | Discharge status: Home / Self Care | Payer: Medicare Other | Attending: Emergency Medicine | Admitting: Emergency Medicine

## 2017-08-11 ENCOUNTER — Other Ambulatory Visit: Payer: Self-pay

## 2017-08-11 ENCOUNTER — Encounter (HOSPITAL_BASED_OUTPATIENT_CLINIC_OR_DEPARTMENT_OTHER): Payer: Self-pay | Admitting: *Deleted

## 2017-08-11 DIAGNOSIS — J45909 Unspecified asthma, uncomplicated: Secondary | ICD-10-CM | POA: Insufficient documentation

## 2017-08-11 DIAGNOSIS — F17210 Nicotine dependence, cigarettes, uncomplicated: Secondary | ICD-10-CM | POA: Insufficient documentation

## 2017-08-11 DIAGNOSIS — S46911D Strain of unspecified muscle, fascia and tendon at shoulder and upper arm level, right arm, subsequent encounter: Secondary | ICD-10-CM | POA: Diagnosis not present

## 2017-08-11 DIAGNOSIS — Z79899 Other long term (current) drug therapy: Secondary | ICD-10-CM | POA: Insufficient documentation

## 2017-08-11 DIAGNOSIS — X58XXXA Exposure to other specified factors, initial encounter: Secondary | ICD-10-CM | POA: Diagnosis not present

## 2017-08-11 DIAGNOSIS — M25511 Pain in right shoulder: Secondary | ICD-10-CM | POA: Diagnosis present

## 2017-08-11 DIAGNOSIS — S46911A Strain of unspecified muscle, fascia and tendon at shoulder and upper arm level, right arm, initial encounter: Secondary | ICD-10-CM | POA: Diagnosis not present

## 2017-08-11 NOTE — Discharge Instructions (Addendum)
Follow-up with your family doctor or occupational health as needed. Get rechecked immediately if you develop severe pain, numbness or weakness to your arm.

## 2017-08-11 NOTE — ED Triage Notes (Signed)
pt needs note to return to work
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
## 2017-09-16 ENCOUNTER — Encounter (HOSPITAL_BASED_OUTPATIENT_CLINIC_OR_DEPARTMENT_OTHER): Payer: Self-pay | Admitting: *Deleted

## 2017-09-16 ENCOUNTER — Other Ambulatory Visit: Payer: Self-pay

## 2017-09-16 ENCOUNTER — Emergency Department (HOSPITAL_BASED_OUTPATIENT_CLINIC_OR_DEPARTMENT_OTHER)
Admission: EM | Admit: 2017-09-16 | Discharge: 2017-09-16 | Disposition: A | Discharge status: Home / Self Care | Payer: Medicare Other | Attending: Emergency Medicine | Admitting: Emergency Medicine

## 2017-09-16 DIAGNOSIS — Z79899 Other long term (current) drug therapy: Secondary | ICD-10-CM | POA: Insufficient documentation

## 2017-09-16 DIAGNOSIS — K0889 Other specified disorders of teeth and supporting structures: Secondary | ICD-10-CM | POA: Diagnosis not present

## 2017-09-16 DIAGNOSIS — F17210 Nicotine dependence, cigarettes, uncomplicated: Secondary | ICD-10-CM | POA: Insufficient documentation

## 2017-09-16 DIAGNOSIS — J45909 Unspecified asthma, uncomplicated: Secondary | ICD-10-CM | POA: Insufficient documentation

## 2017-09-16 MED ORDER — ONDANSETRON 4 MG PO TBDP
4.0000 mg | ORAL_TABLET | Freq: Once | ORAL | Status: AC
  Administered 2017-09-16: 4 mg via ORAL
  Filled 2017-09-16: qty 1

## 2017-09-16 MED ORDER — CLINDAMYCIN HCL 300 MG PO CAPS: 300.0000 mg | ORAL_CAPSULE | Freq: Three times a day (TID) | ORAL | 0 refills | Status: AC

## 2017-09-16 MED ORDER — OXYCODONE-ACETAMINOPHEN 5-325 MG PO TABS
1.0000 | ORAL_TABLET | Freq: Once | ORAL | Status: AC
  Administered 2017-09-16: 1 via ORAL
  Filled 2017-09-16: qty 1

## 2017-09-16 MED FILL — CLINDAMYCIN HCL 300 MG CAP: 300 | 5 days supply | Qty: 15 | Fill #0

## 2017-09-16 NOTE — ED Notes (Signed)
Pt approached nursing station demanding to be seen. EDP notified.

## 2017-09-16 NOTE — ED Notes (Signed)
Pt denies medication PTA.

## 2017-09-16 NOTE — ED Triage Notes (Signed)
Dental pain. States he had an abscess that opened.
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
## 2017-09-16 NOTE — Discharge Instructions (Addendum)
Please take this antibiotic, Clindamycin, for the next 5 days. For pain, you may take Tylenol and/or Ibuprofen. I have added a list of dentists in the area that you can call and follow-up with.  I apologize that you had to wait. Thank you for your patience with me today!

## 2019-07-01 ENCOUNTER — Emergency Department (HOSPITAL_BASED_OUTPATIENT_CLINIC_OR_DEPARTMENT_OTHER)
Admission: EM | Admit: 2019-07-01 | Discharge: 2019-07-01 | Disposition: A | Discharge status: Home / Self Care | Payer: No Typology Code available for payment source | Attending: Emergency Medicine | Admitting: Emergency Medicine

## 2019-07-01 ENCOUNTER — Encounter (HOSPITAL_BASED_OUTPATIENT_CLINIC_OR_DEPARTMENT_OTHER): Payer: Self-pay | Admitting: Emergency Medicine

## 2019-07-01 ENCOUNTER — Other Ambulatory Visit: Payer: Self-pay

## 2019-07-01 DIAGNOSIS — J45909 Unspecified asthma, uncomplicated: Secondary | ICD-10-CM | POA: Diagnosis not present

## 2019-07-01 DIAGNOSIS — Y999 Unspecified external cause status: Secondary | ICD-10-CM | POA: Diagnosis not present

## 2019-07-01 DIAGNOSIS — S3992XA Unspecified injury of lower back, initial encounter: Secondary | ICD-10-CM | POA: Diagnosis present

## 2019-07-01 DIAGNOSIS — F17210 Nicotine dependence, cigarettes, uncomplicated: Secondary | ICD-10-CM | POA: Diagnosis not present

## 2019-07-01 DIAGNOSIS — S39012A Strain of muscle, fascia and tendon of lower back, initial encounter: Secondary | ICD-10-CM | POA: Diagnosis not present

## 2019-07-01 DIAGNOSIS — Y93I9 Activity, other involving external motion: Secondary | ICD-10-CM | POA: Insufficient documentation

## 2019-07-01 DIAGNOSIS — Y92414 Local residential or business street as the place of occurrence of the external cause: Secondary | ICD-10-CM | POA: Diagnosis not present

## 2019-07-01 MED ORDER — IBUPROFEN 400 MG PO TABS
600.0000 mg | ORAL_TABLET | Freq: Once | ORAL | Status: AC
  Administered 2019-07-01: 600 mg via ORAL
  Filled 2019-07-01: qty 1
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
## 2019-07-01 NOTE — Discharge Instructions (Signed)
If you develop worsening, recurrent, or continued back pain, numbness or weakness in the legs, incontinence of your bowels or bladders, numbness of your buttocks, fever, abdominal pain, or any other new/concerning symptoms then return to the ER for evaluation.  
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
## 2020-09-06 ENCOUNTER — Other Ambulatory Visit: Payer: Self-pay

## 2020-09-06 ENCOUNTER — Encounter (HOSPITAL_BASED_OUTPATIENT_CLINIC_OR_DEPARTMENT_OTHER): Payer: Self-pay

## 2020-09-06 ENCOUNTER — Emergency Department (HOSPITAL_BASED_OUTPATIENT_CLINIC_OR_DEPARTMENT_OTHER)
Admission: EM | Admit: 2020-09-06 | Discharge: 2020-09-06 | Disposition: A | Discharge status: Home / Self Care | Payer: 59 | Attending: Emergency Medicine | Admitting: Emergency Medicine

## 2020-09-06 DIAGNOSIS — J45909 Unspecified asthma, uncomplicated: Secondary | ICD-10-CM | POA: Insufficient documentation

## 2020-09-06 DIAGNOSIS — H9202 Otalgia, left ear: Secondary | ICD-10-CM | POA: Insufficient documentation

## 2020-09-06 DIAGNOSIS — F17210 Nicotine dependence, cigarettes, uncomplicated: Secondary | ICD-10-CM | POA: Insufficient documentation

## 2020-09-06 MED ORDER — FLUTICASONE PROPIONATE 50 MCG/ACT NA SUSP: 2.0000 | Freq: Every day | NASAL | 0 refills | Status: AC

## 2020-09-06 MED ORDER — FLUTICASONE PROPIONATE 50 MCG/ACT NA SUSP: 2.0000 | Freq: Every day | NASAL | 0 refills | Status: DC

## 2020-09-06 MED ORDER — ACETAMINOPHEN 325 MG PO TABS
650.0000 mg | ORAL_TABLET | Freq: Once | ORAL | Status: AC
  Administered 2020-09-06: 650 mg via ORAL
  Filled 2020-09-06: qty 2

## 2020-09-06 NOTE — Discharge Instructions (Addendum)
Take medication as directed. Tylenol or ibuprofen for pain. Please refer to the attached instructions.

## 2020-09-06 NOTE — ED Triage Notes (Signed)
Pt c/o left earache x 2 days-NAD-steady gait
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
## 2022-03-05 ENCOUNTER — Emergency Department (HOSPITAL_BASED_OUTPATIENT_CLINIC_OR_DEPARTMENT_OTHER)
Admission: EM | Admit: 2022-03-05 | Discharge: 2022-03-05 | Disposition: A | Discharge status: Home / Self Care | Payer: Medicaid Other | Attending: Emergency Medicine | Admitting: Emergency Medicine

## 2022-03-05 ENCOUNTER — Other Ambulatory Visit: Payer: Self-pay

## 2022-03-05 ENCOUNTER — Emergency Department (HOSPITAL_BASED_OUTPATIENT_CLINIC_OR_DEPARTMENT_OTHER): Payer: Medicaid Other

## 2022-03-05 DIAGNOSIS — Z7951 Long term (current) use of inhaled steroids: Secondary | ICD-10-CM | POA: Diagnosis not present

## 2022-03-05 DIAGNOSIS — U071 COVID-19: Secondary | ICD-10-CM | POA: Insufficient documentation

## 2022-03-05 DIAGNOSIS — F17200 Nicotine dependence, unspecified, uncomplicated: Secondary | ICD-10-CM | POA: Insufficient documentation

## 2022-03-05 DIAGNOSIS — J45909 Unspecified asthma, uncomplicated: Secondary | ICD-10-CM | POA: Insufficient documentation

## 2022-03-05 DIAGNOSIS — R0602 Shortness of breath: Secondary | ICD-10-CM | POA: Diagnosis present

## 2022-03-05 LAB — CBC
HCT: 40.6 % (ref 39.0–52.0)
Hemoglobin: 13.5 g/dL (ref 13.0–17.0)
MCH: 26.8 pg (ref 26.0–34.0)
MCHC: 33.3 g/dL (ref 30.0–36.0)
MCV: 80.7 fL (ref 80.0–100.0)
Platelets: 259 10*3/uL (ref 150–400)
RBC: 5.03 MIL/uL (ref 4.22–5.81)
RDW: 16 % — ABNORMAL HIGH (ref 11.5–15.5)
WBC: 9.3 10*3/uL (ref 4.0–10.5)
nRBC: 0 % (ref 0.0–0.2)

## 2022-03-05 LAB — RESP PANEL BY RT-PCR (RSV, FLU A&B, COVID)  RVPGX2
Influenza A by PCR: NEGATIVE
Influenza B by PCR: NEGATIVE
Resp Syncytial Virus by PCR: NEGATIVE
SARS Coronavirus 2 by RT PCR: POSITIVE — AB

## 2022-03-05 LAB — TROPONIN I (HIGH SENSITIVITY)
Troponin I (High Sensitivity): 2 ng/L (ref <18)
Troponin I (High Sensitivity): <2 ng/L (ref <18)

## 2022-03-05 LAB — BASIC METABOLIC PANEL
Anion gap: 10 (ref 5–15)
BUN: 11 mg/dL (ref 6–20)
CO2: 20 mmol/L — ABNORMAL LOW (ref 22–32)
Calcium: 8.5 mg/dL — ABNORMAL LOW (ref 8.9–10.3)
Chloride: 102 mmol/L (ref 98–111)
Creatinine, Ser: 1.15 mg/dL (ref 0.61–1.24)
GFR, Estimated: >60 mL/min (ref >60)
Glucose, Bld: 104 mg/dL — ABNORMAL HIGH (ref 70–99)
Potassium: 3.5 mmol/L (ref 3.5–5.1)
Sodium: 132 mmol/L — ABNORMAL LOW (ref 135–145)

## 2022-03-05 MED ORDER — ALBUTEROL SULFATE HFA 108 (90 BASE) MCG/ACT IN AERS
2.0000 | INHALATION_SPRAY | Freq: Four times a day (QID) | RESPIRATORY_TRACT | Status: DC | PRN
  Administered 2022-03-05: 2 via RESPIRATORY_TRACT
  Filled 2022-03-05: qty 6.7

## 2022-03-05 MED ORDER — ACETAMINOPHEN 325 MG PO TABS
650.0000 mg | ORAL_TABLET | Freq: Once | ORAL | Status: AC
  Administered 2022-03-05: 650 mg via ORAL
  Filled 2022-03-05: qty 2

## 2022-03-05 NOTE — Discharge Instructions (Addendum)
Use albuterol inhaler 2 puffs every 6 hours at least for the next 7 days.  Testing positive for COVID.  Oxygen saturation levels here are good and chest x-ray negative for pneumonia.  Return for any new or worse breathing problems.  Use over-the-counter cold and flu medicines.  Also if you do not use any over-the-counter cold and flu medicines would take extra strength Tylenol for the fever.  Keep in mind that most over-the-counter cold and flu medicines already have Tylenol in it so you cannot do both.

## 2022-03-05 NOTE — ED Provider Notes (Signed)
[place] [facility] Provider Note   CSN: [id number] Arrival date & time: 03/05/22  1624     History  Chief Complaint  Patient presents with   Shortness of Breath    Alexis Chapman is a 47 y.o. male.  Patient with the onset of fever chills body aches no sore throat nausea matting or diarrhea.  No other family members are sick.  Patient has a history of asthma and was concerned about an asthma attack.  Temp on arrival here was 100.7.  Heart rate 102 but went down to 83 respirations 22 it went down to 18 oxygen saturations on room air 99% and currently 98%.  Blood pressure 128/76.  Patient did have the initial COVID vaccines.  But has not had any recently.  Past medical history is significant for asthma.  Patient's albuterol inhaler at home is almost out.  So he will need a renewal on that.  Onset of symptoms was yesterday.  Patient is an everyday smoker.       Home Medications Prior to Admission medications   Medication Sig Start Date End Date Taking? Authorizing Provider  diazepam (VALIUM) 5 MG tablet Take 1 tablet (5 mg total) by mouth every 6 (six) hours as needed for muscle spasms (spasms). 08/05/17   Deno Etienne, DO  fluticasone (FLONASE) 50 MCG/ACT nasal spray Place 2 sprays into both nostrils daily. 09/06/20   Etta Quill, NP  lidocaine (LIDODERM) 5 % Place 1 patch onto the skin daily. Remove & Discard patch within 12 hours or as directed by MD 05/24/15   Randal Buba, April, MD  naproxen (NAPROSYN) 375 MG tablet Take 1 tablet (375 mg total) by mouth 2 (two) times daily. 05/24/15   Palumbo, April, MD  polyethylene glycol powder (MIRALAX) powder Take 17 g by mouth daily. 05/24/15   Palumbo, April, MD      Allergies    Shellfish allergy, Penicillins, and Shellfish allergy    Review of Systems   Review of Systems  Constitutional:  Positive for fever. Negative for chills.  HENT:  Negative for congestion, ear pain and sore throat.   Eyes:  Negative  for pain and visual disturbance.  Respiratory:  Positive for cough. Negative for shortness of breath.   Cardiovascular:  Negative for chest pain and palpitations.  Gastrointestinal:  Negative for abdominal pain and vomiting.  Genitourinary:  Negative for dysuria and hematuria.  Musculoskeletal:  Positive for myalgias. Negative for arthralgias and back pain.  Skin:  Negative for color change and rash.  Neurological:  Negative for seizures and syncope.  All other systems reviewed and are negative.   Physical Exam Updated Vital Signs BP (!) 145/77   Pulse 81   Temp (!) 100.9 F (38.3 C) (Oral)   Resp 18   Ht 1.778 m (5' 10"$ )   Wt 95.3 kg   SpO2 100%   BMI 30.13 kg/m  Physical Exam Vitals and nursing note reviewed.  Constitutional:      General: He is not in acute distress.    Appearance: Normal appearance. He is well-developed.  HENT:     Head: Normocephalic and atraumatic.     Mouth/Throat:     Mouth: Mucous membranes are moist.  Eyes:     Extraocular Movements: Extraocular movements intact.     Conjunctiva/sclera: Conjunctivae normal.     Pupils: Pupils are equal, round, and reactive to light.  Cardiovascular:     Rate and Rhythm: Normal rate and regular rhythm.  Heart sounds: No murmur heard. Pulmonary:     Effort: Pulmonary effort is normal. No respiratory distress.     Breath sounds: Normal breath sounds. No stridor. No wheezing, rhonchi or rales.  Abdominal:     Palpations: Abdomen is soft.     Tenderness: There is no abdominal tenderness.  Musculoskeletal:        General: No swelling.     Cervical back: Normal range of motion and neck supple.  Skin:    General: Skin is warm and dry.     Capillary Refill: Capillary refill takes less than 2 seconds.  Neurological:     General: No focal deficit present.     Mental Status: He is alert and oriented to person, place, and time.  Psychiatric:        Mood and Affect: Mood normal.     ED Results / Procedures /  Treatments   Labs (all labs ordered are listed, but only abnormal results are displayed) Labs Reviewed  RESP PANEL BY RT-PCR (RSV, FLU A&B, COVID)  RVPGX2 - Abnormal; Notable for the following components:      Result Value   SARS Coronavirus 2 by RT PCR POSITIVE (*)    All other components within normal limits  BASIC METABOLIC PANEL - Abnormal; Notable for the following components:   Sodium 132 (*)    CO2 20 (*)    Glucose, Bld 104 (*)    Calcium 8.5 (*)    All other components within normal limits  CBC - Abnormal; Notable for the following components:   RDW 16.0 (*)    All other components within normal limits  TROPONIN I (HIGH SENSITIVITY)  TROPONIN I (HIGH SENSITIVITY)    EKG EKG Interpretation  Date/Time:  Tuesday March 05 2022 16:35:34 EST Ventricular Rate:  98 PR Interval:  135 QRS Duration: 99 QT Interval:  333 QTC Calculation: 426 R Axis:   89 Text Interpretation: Sinus rhythm Probable left atrial enlargement Borderline T wave abnormalities ST elev, probable normal early repol pattern Confirmed by Fredia Sorrow [phone]) on 03/05/2022 10:12:06 PM  Radiology DG Chest 2 View  Result Date: 03/05/2022 CLINICAL DATA:  Shortness of breath EXAM: CHEST - 2 VIEW COMPARISON:  Chest x-ray 03/05/2022 FINDINGS: The heart size and mediastinal contours are within normal limits. There is linear atelectasis in the right lower lobe. Lungs are otherwise clear. There is no pleural effusion or pneumothorax identified. No acute fractures are seen. IMPRESSION: Linear atelectasis in the right lower lobe. No other acute cardiopulmonary process. Electronically Signed   By: Ronney Asters M.D.   On: 03/05/2022 17:03    Procedures Procedures    Medications Ordered in ED Medications  albuterol (VENTOLIN HFA) 108 (90 Base) MCG/ACT inhaler 2 puff (has no administration in time range)  acetaminophen (TYLENOL) tablet 650 mg (650 mg Oral Given 03/05/22 1641)    ED Course/ Medical Decision  Making/ A&P                             Medical Decision Making Amount and/or Complexity of Data Reviewed Labs: ordered. Radiology: ordered.  Risk OTC drugs. Prescription drug management.   Patient's aspiratory panel positive for COVID.  Troponins x 2 without significant changes.  CBC white blood cell count 9.3 hemoglobin [id number] basic metabolic panel sodium down a little bit at 132 renal function normal.  Glucose 104.  Chest x-ray without any acute findings.  Patient's symptoms consistent with  COVID onset was yesterday patient nontoxic no acute distress.  Will give albuterol inhaler to go.  Recommend over-the-counter cold and flu medicines.  Precautions provided.  Work note provided.  Patient without any respiratory distress oxygen saturations in the upper 90s.   Final Clinical Impression(s) / ED Diagnoses Final diagnoses:  COVID    Rx / DC Orders ED Discharge Orders     None         Fredia Sorrow, MD 03/05/22 2240

## 2022-03-05 NOTE — ED Triage Notes (Signed)
Pt arrives with c/o SOB that started this morning after having an asthma attack. Pt did use rescue inhaler and helped with asthma attack, but still having SOB. Pt denies sick contacts. Pt febrile in triage and endorse generalized body aches.

## 2022-11-06 ENCOUNTER — Emergency Department (HOSPITAL_COMMUNITY)
Admission: EM | Admit: 2022-11-06 | Discharge: 2022-11-07 | Discharge status: Against Medical Advice | Payer: Managed Care, Other (non HMO) | Attending: Emergency Medicine | Admitting: Emergency Medicine

## 2022-11-06 ENCOUNTER — Other Ambulatory Visit: Payer: Self-pay

## 2022-11-06 DIAGNOSIS — Z5321 Procedure and treatment not carried out due to patient leaving prior to being seen by health care provider: Secondary | ICD-10-CM | POA: Diagnosis not present

## 2022-11-06 DIAGNOSIS — R109 Unspecified abdominal pain: Secondary | ICD-10-CM | POA: Diagnosis present

## 2022-11-06 LAB — URINALYSIS, ROUTINE W REFLEX MICROSCOPIC
Bilirubin Urine: NEGATIVE
Glucose, UA: NEGATIVE mg/dL
Hgb urine dipstick: NEGATIVE
Ketones, ur: 5 mg/dL — AB
Leukocytes,Ua: NEGATIVE
Nitrite: NEGATIVE
Protein, ur: NEGATIVE mg/dL
Specific Gravity, Urine: 1.026 (ref 1.005–1.030)
pH: 6 (ref 5.0–8.0)
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
## 2022-11-07 NOTE — ED Provider Notes (Signed)
Left prior to my evaluation.   Nira Conn, MD 11/07/22 [phone]

## 2023-11-21 ENCOUNTER — Encounter (HOSPITAL_BASED_OUTPATIENT_CLINIC_OR_DEPARTMENT_OTHER): Payer: Self-pay

## 2023-11-21 ENCOUNTER — Other Ambulatory Visit: Payer: Self-pay

## 2023-11-21 ENCOUNTER — Emergency Department (HOSPITAL_BASED_OUTPATIENT_CLINIC_OR_DEPARTMENT_OTHER)
Admission: EM | Admit: 2023-11-21 | Discharge: 2023-11-21 | Disposition: A | Discharge status: Home / Self Care | Attending: Emergency Medicine | Admitting: Emergency Medicine

## 2023-11-21 DIAGNOSIS — M542 Cervicalgia: Secondary | ICD-10-CM | POA: Diagnosis present

## 2023-11-21 DIAGNOSIS — J0100 Acute maxillary sinusitis, unspecified: Secondary | ICD-10-CM | POA: Insufficient documentation

## 2023-11-21 DIAGNOSIS — R591 Generalized enlarged lymph nodes: Secondary | ICD-10-CM

## 2023-11-21 DIAGNOSIS — R590 Localized enlarged lymph nodes: Secondary | ICD-10-CM | POA: Insufficient documentation

## 2023-11-21 MED ORDER — IBUPROFEN 400 MG PO TABS
600.0000 mg | ORAL_TABLET | Freq: Once | ORAL | Status: AC
  Administered 2023-11-21: 600 mg via ORAL
  Filled 2023-11-21: qty 1

## 2023-11-21 MED ORDER — DOXYCYCLINE HYCLATE 100 MG PO TABS
100.0000 mg | ORAL_TABLET | Freq: Once | ORAL | Status: AC
  Administered 2023-11-21: 100 mg via ORAL
  Filled 2023-11-21: qty 1

## 2023-11-21 MED ORDER — DOXYCYCLINE HYCLATE 100 MG PO CAPS: 100.0000 mg | ORAL_CAPSULE | Freq: Two times a day (BID) | ORAL | 0 refills | Status: AC

## 2023-11-21 NOTE — Discharge Instructions (Signed)
 Take the antibiotic as prescribed. Follow up with your doctor if symptoms persist. Recommend ibuprofen  for discomfort.

## 2023-11-21 NOTE — ED Triage Notes (Signed)
 Pt to ED from home with c/o L sided neck swelling for the past 2 days, no history of the same. Pt states he thinks its his lymph nodes. Arrives A+O, VSS, NADN.

## 2023-11-21 NOTE — ED Provider Notes (Signed)
 [place] EMERGENCY DEPARTMENT AT [facility] Provider Note   CSN: [id number] Arrival date & time: 11/21/23  2106     Patient presents with: swollen neck   Alexis Chapman is a 48 y.o. male.   Patient to ED for evaluation of pain in the left neck below the jaw line that started 2 days ago. He denies fever, sore throat or difficulty swallowing. He denies dental pain or dental injury. He reports his sinuses are draining and the left feels like it drains more. No cough. Patient is an active smoker.  The history is provided by the patient. No language interpreter was used.       Prior to Admission medications   Medication Sig Start Date End Date Taking? Authorizing Provider  diazepam  (VALIUM ) 5 MG tablet Take 1 tablet (5 mg total) by mouth every 6 (six) hours as needed for muscle spasms (spasms). 08/05/17   Emil Share, DO  fluticasone  (FLONASE ) 50 MCG/ACT nasal spray Place 2 sprays into both nostrils daily. 09/06/20   Claudene Lenis, NP  lidocaine  (LIDODERM ) 5 % Place 1 patch onto the skin daily. Remove & Discard patch within 12 hours or as directed by MD 05/24/15   Nettie, April, MD  naproxen  (NAPROSYN ) 375 MG tablet Take 1 tablet (375 mg total) by mouth 2 (two) times daily. 05/24/15   Palumbo, April, MD  polyethylene glycol powder (MIRALAX ) powder Take 17 g by mouth daily. 05/24/15   Palumbo, April, MD    Allergies: Shellfish allergy, Penicillins, and Shellfish allergy    Review of Systems  Updated Vital Signs BP (!) 135/91 (BP Location: Right Arm)   Pulse 76   Temp 98.6 F (37 C) (Oral)   Resp 16   Ht 5' 10 (1.778 m)   Wt 83.9 kg   SpO2 98%   BMI 26.54 kg/m   Physical Exam Vitals and nursing note reviewed.  Constitutional:      Appearance: Normal appearance. He is not ill-appearing.  HENT:     Head: Normocephalic.     Right Ear: Tympanic membrane and ear canal normal.     Left Ear: Tympanic membrane and ear canal normal.     Nose: Nose normal. No rhinorrhea.      Mouth/Throat:     Mouth: Mucous membranes are moist.     Comments: Partially edentulous. No visualized apical abscess. There is buccal  swelling adjacent to the rear molars that is not indurated or tender. No facial swelling. Single swollen/tender left submandibular lymph node palpable.  Cardiovascular:     Rate and Rhythm: Normal rate.  Pulmonary:     Effort: Pulmonary effort is normal.  Musculoskeletal:        General: Normal range of motion.  Skin:    General: Skin is warm and dry.  Neurological:     Mental Status: He is alert and oriented to person, place, and time.     (all labs ordered are listed, but only abnormal results are displayed) Labs Reviewed - No data to display  EKG: None  Radiology: No results found.   Procedures   Medications Ordered in the ED  doxycycline (VIBRA-TABS) tablet 100 mg (has no administration in time range)  ibuprofen  (ADVIL ) tablet 600 mg (has no administration in time range)    Clinical Course as of 11/21/23 2222  Fri Nov 21, 2023  2217 Patient with tender lymph node left submandibular area with L>R maxiallary drainage. No fever. He appears uncomfortable but not toxic. VSS. +smoker.  Will cover with abx. PCN allergic. Will Rx doxycycline [SU]    Clinical Course User Index [SU] Odell Balls, PA-C                                 Medical Decision Making       Final diagnoses:  Acute non-recurrent maxillary sinusitis  Lymphadenopathy    ED Discharge Orders     None          Odell Balls RIGGERS 11/21/23 2222    Yolande Lamar BROCKS, MD 11/22/23 [phone]
